# Patient Record
Sex: Female | Born: 2000 | Race: White | Hispanic: No | Marital: Single | Smoking: Never smoker
Health system: Southern US, Community
[De-identification: ages and names within clinical notes are randomized; demographics above are authoritative.]

## PROBLEM LIST (undated history)

## (undated) DIAGNOSIS — F32A Depression, unspecified: Secondary | ICD-10-CM

---

## 2019-11-25 ENCOUNTER — Other Ambulatory Visit: Payer: Self-pay | Admitting: Gastroenterology

## 2019-11-25 DIAGNOSIS — R1013 Epigastric pain: Secondary | ICD-10-CM

## 2019-11-25 DIAGNOSIS — R112 Nausea with vomiting, unspecified: Secondary | ICD-10-CM

## 2019-12-15 ENCOUNTER — Ambulatory Visit
Admission: RE | Admit: 2019-12-15 | Discharge: 2019-12-15 | Disposition: A | Discharge status: Home / Self Care | Payer: 59 | Source: Ambulatory Visit | Attending: Gastroenterology | Admitting: Gastroenterology

## 2019-12-15 DIAGNOSIS — R1013 Epigastric pain: Secondary | ICD-10-CM

## 2019-12-15 DIAGNOSIS — R112 Nausea with vomiting, unspecified: Secondary | ICD-10-CM

## 2019-12-15 IMAGING — US US ABDOMEN LIMITED
1 series · 14 of 25 positions shown · non-contrast
Comparison: None.

CLINICAL DATA: Epigastric pain

EXAM:
ULTRASOUND ABDOMEN LIMITED RIGHT UPPER QUADRANT

[Series 1: us abdomen limited · 0.15mm/px · 14 of 48 slices shown]
[im 1/48]
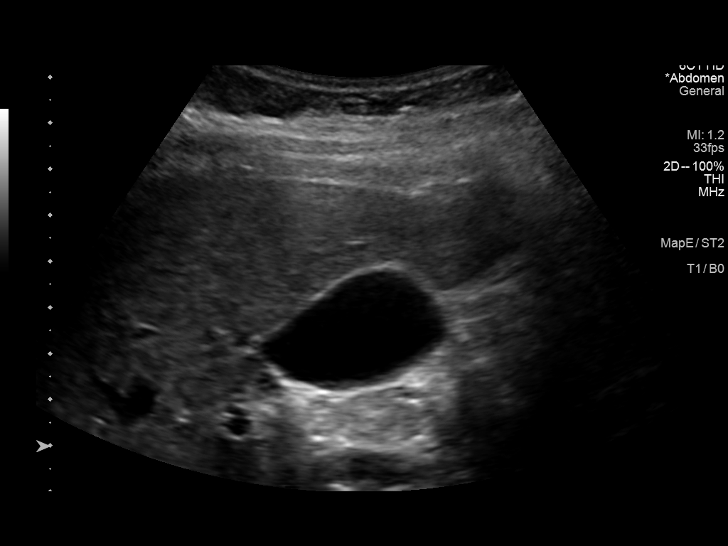
[im 4/48]
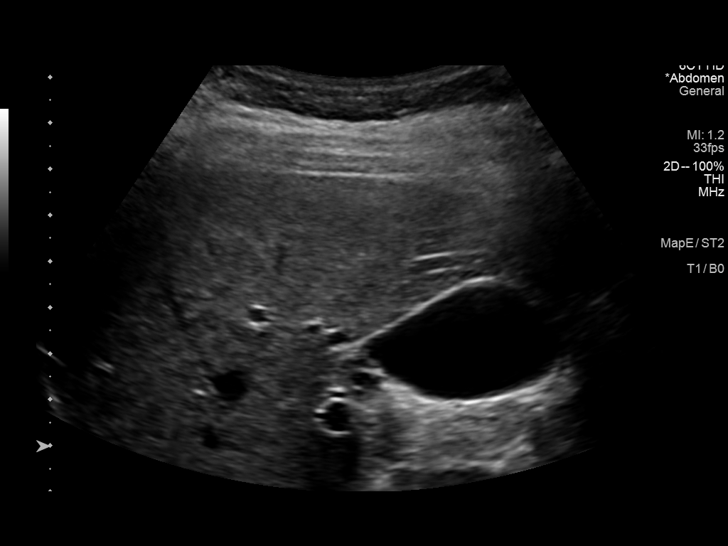
[im 8/48]
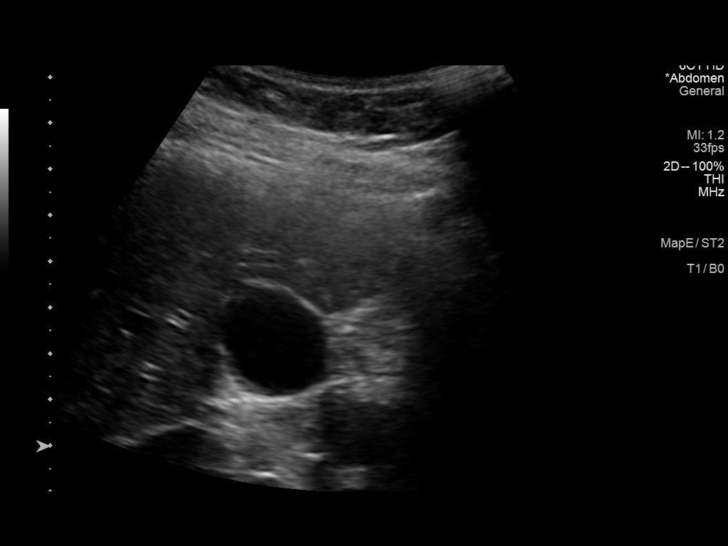
[im 12/48]
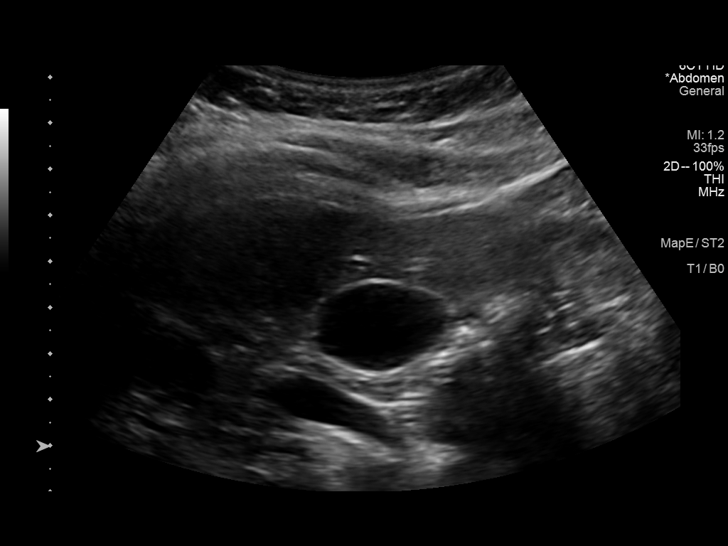
[im 16/48]
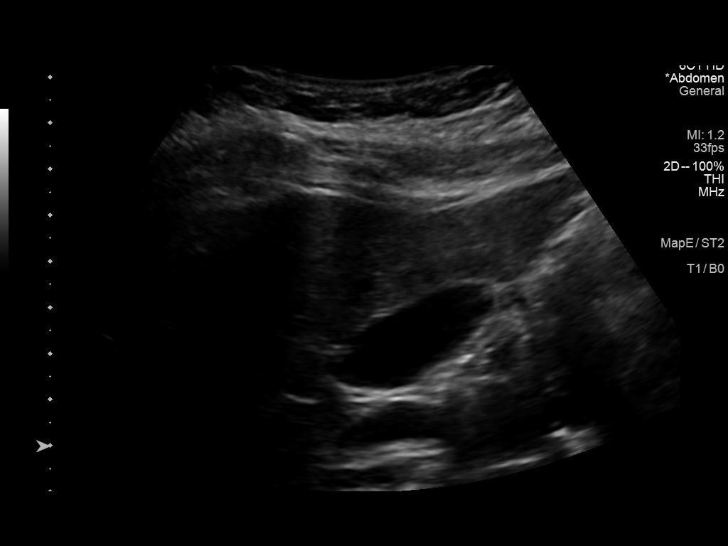
[im 18/48]
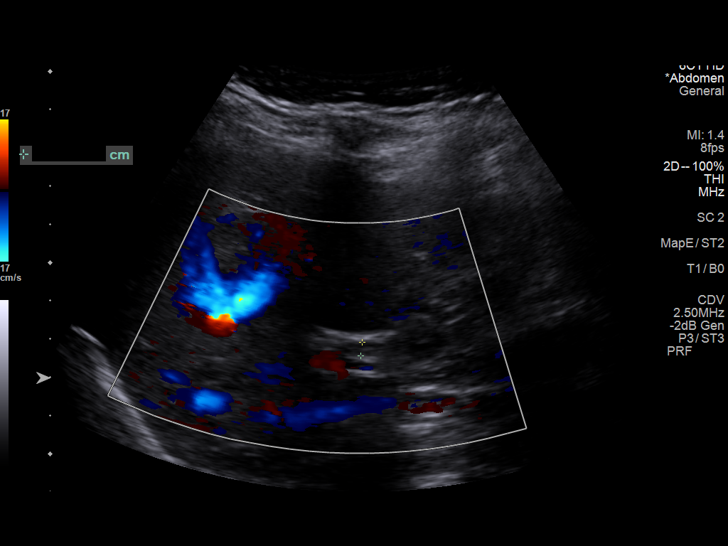
[im 22/48]
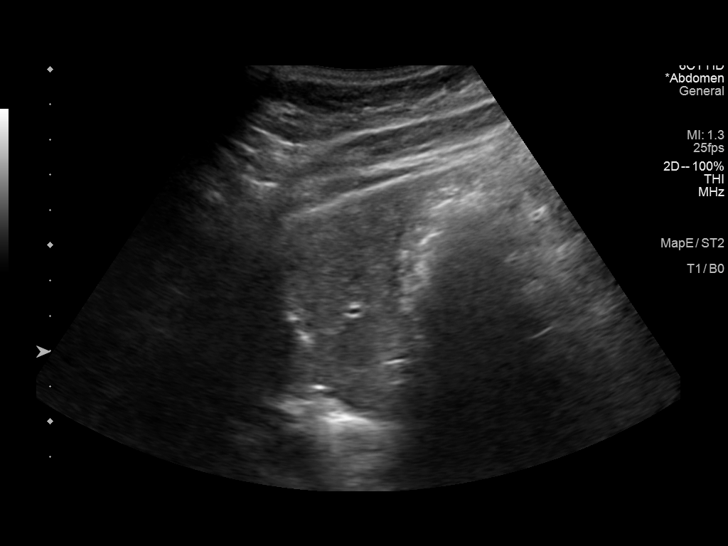
[im 26/48]
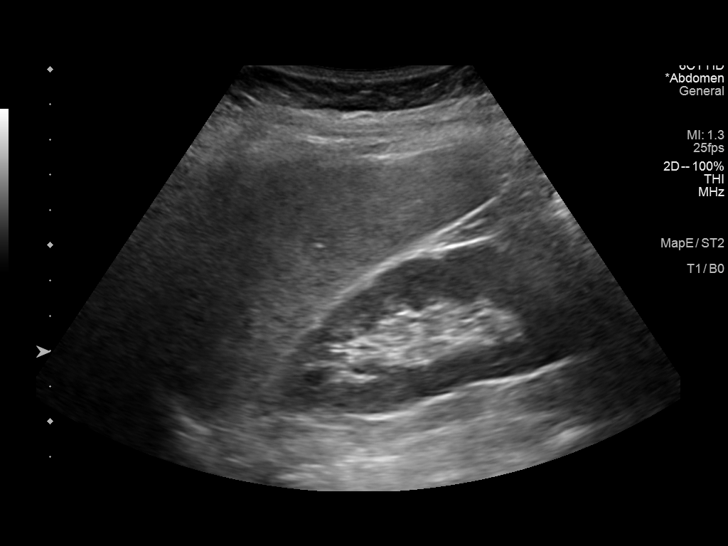
[im 30/48]
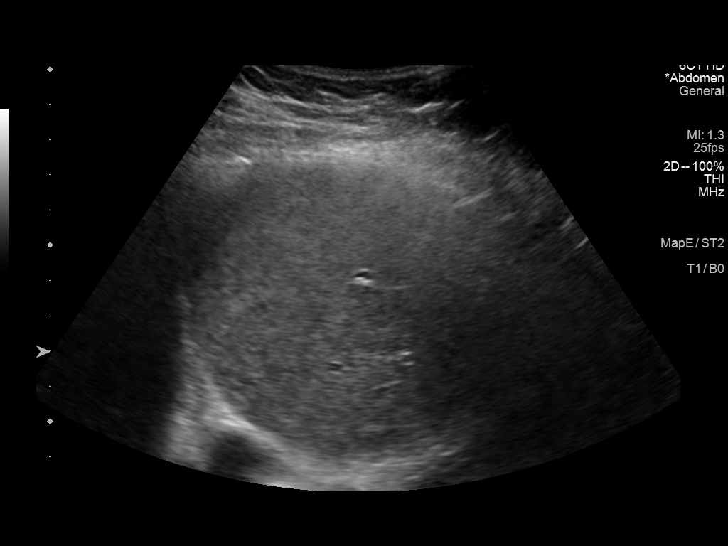
[im 32/48]
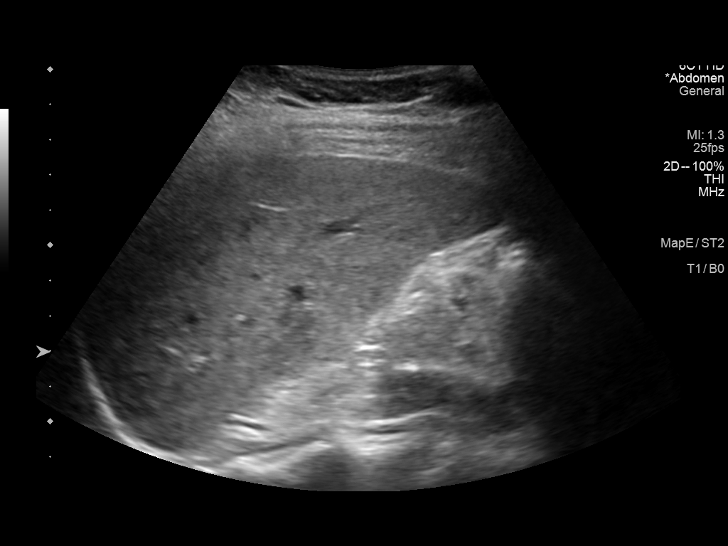
[im 36/48]
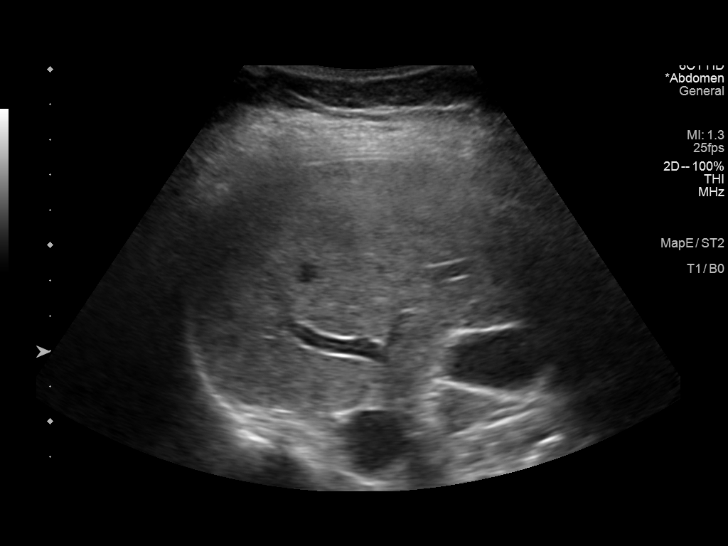
[im 40/48]
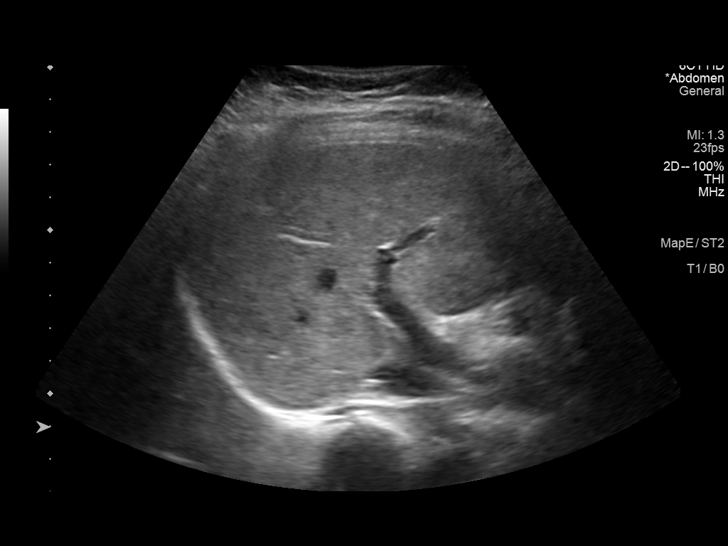
[im 44/48]
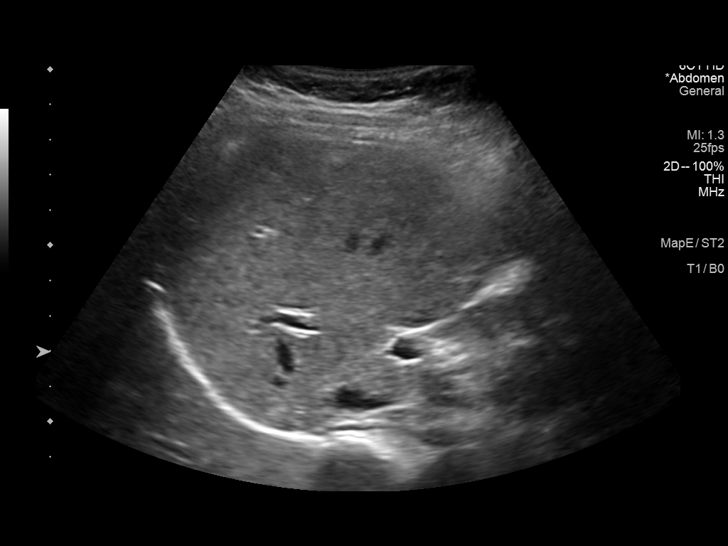
[im 48/48]
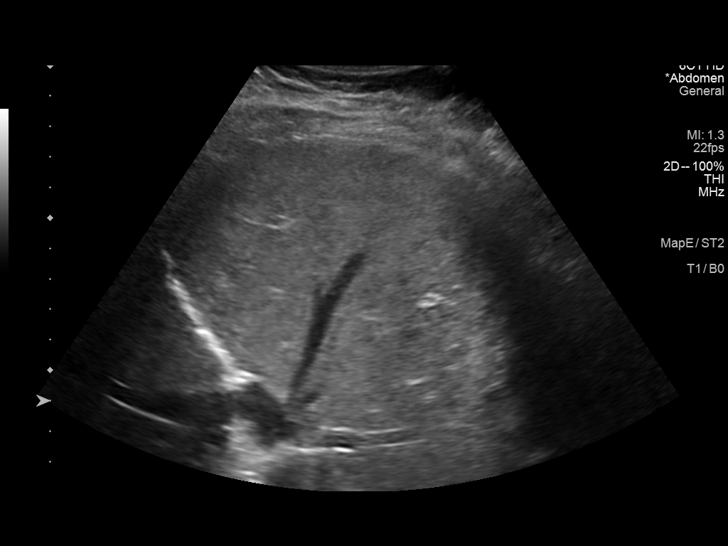

[14 of 25 positions shown; findings below may reference images not displayed]

FINDINGS: Gallbladder:

No gallstones or wall thickening visualized. No sonographic Murphy
sign noted by sonographer.

Common bile duct:

Diameter: 3.6 mm

Liver:

No focal lesion identified. Within normal limits in parenchymal
echogenicity. Portal vein is patent on color Doppler imaging with
normal direction of blood flow towards the liver.

Other: None.
IMPRESSION: Negative right upper quadrant abdominal ultrasound

## 2020-06-13 ENCOUNTER — Other Ambulatory Visit: Payer: Self-pay | Admitting: Physician Assistant

## 2020-06-13 DIAGNOSIS — R112 Nausea with vomiting, unspecified: Secondary | ICD-10-CM

## 2020-07-01 ENCOUNTER — Other Ambulatory Visit: Payer: Self-pay

## 2020-07-01 ENCOUNTER — Encounter (HOSPITAL_COMMUNITY)
Admission: RE | Admit: 2020-07-01 | Discharge: 2020-07-01 | Disposition: A | Discharge status: Home / Self Care | Payer: 59 | Source: Ambulatory Visit | Attending: Physician Assistant | Admitting: Physician Assistant

## 2020-07-01 DIAGNOSIS — R112 Nausea with vomiting, unspecified: Secondary | ICD-10-CM | POA: Insufficient documentation

## 2020-07-01 MED ORDER — TECHNETIUM TC 99M SULFUR COLLOID
2.1000 | Freq: Once | INTRAVENOUS | Status: AC | PRN
  Administered 2020-07-01: 2.1 via INTRAVENOUS

## 2020-09-23 ENCOUNTER — Other Ambulatory Visit: Payer: Self-pay

## 2020-09-23 ENCOUNTER — Encounter (HOSPITAL_COMMUNITY): Payer: Self-pay

## 2020-09-23 ENCOUNTER — Emergency Department (HOSPITAL_COMMUNITY)
Admission: EM | Admit: 2020-09-23 | Discharge: 2020-09-24 | Disposition: A | Discharge status: Other Acute Inpatient Hospital | Payer: 59 | Attending: Emergency Medicine | Admitting: Emergency Medicine

## 2020-09-23 DIAGNOSIS — X58XXXA Exposure to other specified factors, initial encounter: Secondary | ICD-10-CM | POA: Insufficient documentation

## 2020-09-23 DIAGNOSIS — R440 Auditory hallucinations: Secondary | ICD-10-CM | POA: Diagnosis not present

## 2020-09-23 DIAGNOSIS — R441 Visual hallucinations: Secondary | ICD-10-CM | POA: Insufficient documentation

## 2020-09-23 DIAGNOSIS — Z20822 Contact with and (suspected) exposure to covid-19: Secondary | ICD-10-CM | POA: Insufficient documentation

## 2020-09-23 DIAGNOSIS — D72829 Elevated white blood cell count, unspecified: Secondary | ICD-10-CM | POA: Diagnosis not present

## 2020-09-23 DIAGNOSIS — T43222A Poisoning by selective serotonin reuptake inhibitors, intentional self-harm, initial encounter: Secondary | ICD-10-CM | POA: Diagnosis present

## 2020-09-23 DIAGNOSIS — Y900 Blood alcohol level of less than 20 mg/100 ml: Secondary | ICD-10-CM | POA: Insufficient documentation

## 2020-09-23 DIAGNOSIS — T1491XA Suicide attempt, initial encounter: Secondary | ICD-10-CM

## 2020-09-23 HISTORY — DX: Depression, unspecified: F32.A

## 2020-09-23 LAB — SALICYLATE LEVEL: Salicylate Lvl: <7 mg/dL — ABNORMAL LOW (ref 7.0–30.0)

## 2020-09-23 LAB — CBC
HCT: 41.3 % (ref 36.0–46.0)
Hemoglobin: 13.3 g/dL (ref 12.0–15.0)
MCH: 28.7 pg (ref 26.0–34.0)
MCHC: 32.2 g/dL (ref 30.0–36.0)
MCV: 89.2 fL (ref 80.0–100.0)
Platelets: 383 10*3/uL (ref 150–400)
RBC: 4.63 MIL/uL (ref 3.87–5.11)
RDW: 14.6 % (ref 11.5–15.5)
WBC: 15 10*3/uL — ABNORMAL HIGH (ref 4.0–10.5)
nRBC: 0 % (ref 0.0–0.2)

## 2020-09-23 LAB — ETHANOL: Alcohol, Ethyl (B): <10 mg/dL (ref <10)

## 2020-09-23 LAB — MAGNESIUM: Magnesium: 2.2 mg/dL (ref 1.7–2.4)

## 2020-09-23 LAB — RAPID URINE DRUG SCREEN, HOSP PERFORMED
Amphetamines: NOT DETECTED
Barbiturates: NOT DETECTED
Benzodiazepines: NOT DETECTED
Cocaine: NOT DETECTED
Opiates: NOT DETECTED
Tetrahydrocannabinol: NOT DETECTED

## 2020-09-23 LAB — COMPREHENSIVE METABOLIC PANEL
ALT: 18 U/L (ref 0–44)
AST: 20 U/L (ref 15–41)
Albumin: 4.3 g/dL (ref 3.5–5.0)
Alkaline Phosphatase: 95 U/L (ref 38–126)
Anion gap: 8 (ref 5–15)
BUN: 7 mg/dL (ref 6–20)
CO2: 26 mmol/L (ref 22–32)
Calcium: 9.3 mg/dL (ref 8.9–10.3)
Chloride: 106 mmol/L (ref 98–111)
Creatinine, Ser: 0.62 mg/dL (ref 0.44–1.00)
GFR, Estimated: >60 mL/min (ref >60)
Glucose, Bld: 115 mg/dL — ABNORMAL HIGH (ref 70–99)
Potassium: 3.8 mmol/L (ref 3.5–5.1)
Sodium: 140 mmol/L (ref 135–145)
Total Bilirubin: 0.6 mg/dL (ref 0.3–1.2)
Total Protein: 8.6 g/dL — ABNORMAL HIGH (ref 6.5–8.1)

## 2020-09-23 LAB — I-STAT BETA HCG BLOOD, ED (MC, WL, AP ONLY): I-stat hCG, quantitative: <5 m[IU]/mL (ref <5)

## 2020-09-23 LAB — ACETAMINOPHEN LEVEL: Acetaminophen (Tylenol), Serum: <10 ug/mL — ABNORMAL LOW (ref 10–30)

## 2020-09-23 LAB — RESP PANEL BY RT-PCR (FLU A&B, COVID) ARPGX2
Influenza A by PCR: NEGATIVE
Influenza B by PCR: NEGATIVE
SARS Coronavirus 2 by RT PCR: NEGATIVE

## 2020-09-23 MED ORDER — POTASSIUM CHLORIDE CRYS ER 20 MEQ PO TBCR
60.0000 meq | EXTENDED_RELEASE_TABLET | Freq: Once | ORAL | Status: AC
  Administered 2020-09-23: 60 meq via ORAL
  Filled 2020-09-23: qty 3

## 2020-09-23 NOTE — BH Assessment (Signed)
Marchelle Folks, NT/NS to fax [phone] or [phone]) pt's IVC paperwork.     Redmond Pulling, MS, [facility], [facility] Triage Specialist [phone]

## 2020-09-23 NOTE — ED Notes (Signed)
Poison control states pt has completed the 12-hour observation period and has been cleared on their end.

## 2020-09-23 NOTE — ED Notes (Signed)
Please call mom for an update when you can.

## 2020-09-23 NOTE — ED Notes (Signed)
ED Provider at bedside. 

## 2020-09-23 NOTE — BH Assessment (Signed)
Clinician messaged Louellen Molder, RN via secure chat in Epic and asked "is the pt able to assessed, if so the pt will need to be placed in a private room. Also is the pt under IVC."   Clinician awaiting response.   Redmond Pulling, MS, [facility], [facility] Triage Specialist [phone]

## 2020-09-23 NOTE — ED Provider Notes (Addendum)
[facility]-EMERGENCY DEPT Provider Note   CSN: [id number] Arrival date & time: 09/23/20  1145     History Chief Complaint  Patient presents with   Suicidal    Brenda Newman is a 20 y.o. female.  HPI  Patient is a 20 year old female with a history of depression who presents to the emergency department due to purposeful ingestion of Prozac in an attempt to commit suicide.  Patient states that she has a history of depression.  She states that nothing new or abnormal happened recently but just "felt worse than normal this morning" so she took about 400 mg of Prozac around 10:30 AM.  She reports associated visual and auditory hallucinations.  She states that "sometimes I hear whispers".  She also states that "sometimes I see man walking around the house or sometimes see people walking around outside that are not there".  Denies any homicidal ideation.  She reports current fatigue and nausea but otherwise has no physical complaints.     Past Medical History:  Diagnosis Date   Depression     There are no problems to display for this patient.   History reviewed. No pertinent surgical history.   OB History   No obstetric history on file.     Family History  Problem Relation Age of Onset   Diabetes Mother     Social History   Tobacco Use   Smoking status: Never   Smokeless tobacco: Never  Vaping Use   Vaping Use: Never used  Substance Use Topics   Alcohol use: Never   Drug use: Never    Home Medications Prior to Admission medications   Not on File    Allergies    Patient has no known allergies.  Review of Systems   Review of Systems  All other systems reviewed and are negative. Ten systems reviewed and are negative for acute change, except as noted in the HPI.   Physical Exam Updated Vital Signs BP 131/85   Pulse (!) 108   Temp 98.7 F (37.1 C) (Oral)   Resp 16   Ht 5' (1.524 m)   Wt 61.2 kg   LMP 09/01/2020 (Approximate)   SpO2  100%   BMI 26.37 kg/m   Physical Exam Vitals and nursing note reviewed.  Constitutional:      General: She is not in acute distress.    Appearance: Normal appearance. She is not ill-appearing, toxic-appearing or diaphoretic.  HENT:     Head: Normocephalic and atraumatic.     Right Ear: External ear normal.     Left Ear: External ear normal.     Nose: Nose normal.     Mouth/Throat:     Mouth: Mucous membranes are moist.     Pharynx: Oropharynx is clear. No oropharyngeal exudate or posterior oropharyngeal erythema.  Eyes:     Extraocular Movements: Extraocular movements intact.  Cardiovascular:     Rate and Rhythm: Normal rate and regular rhythm.     Pulses: Normal pulses.     Heart sounds: Normal heart sounds. No murmur heard.   No friction rub. No gallop.  Pulmonary:     Effort: Pulmonary effort is normal. No respiratory distress.     Breath sounds: Normal breath sounds. No stridor. No wheezing, rhonchi or rales.  Abdominal:     General: Abdomen is flat.     Palpations: Abdomen is soft.     Tenderness: There is no abdominal tenderness.  Musculoskeletal:  General: Normal range of motion.     Cervical back: Normal range of motion and neck supple. No tenderness.  Skin:    General: Skin is warm and dry.     Comments: Well-healing self-inflicted linear lacerations to the left forearm.  Neurological:     General: No focal deficit present.     Mental Status: She is alert and oriented to person, place, and time.  Psychiatric:        Attention and Perception: Attention normal.        Mood and Affect: Mood normal.        Behavior: Behavior normal. Behavior is not agitated or aggressive.        Thought Content: Thought content is delusional. Thought content is not paranoid. Thought content includes suicidal ideation. Thought content does not include homicidal ideation. Thought content includes suicidal plan. Thought content does not include homicidal plan.   ED Results /  Procedures / Treatments   Labs (all labs ordered are listed, but only abnormal results are displayed) Labs Reviewed  COMPREHENSIVE METABOLIC PANEL - Abnormal; Notable for the following components:      Result Value   Glucose, Bld 115 (*)    Total Protein 8.6 (*)    All other components within normal limits  SALICYLATE LEVEL - Abnormal; Notable for the following components:   Salicylate Lvl <7.0 (*)    All other components within normal limits  ACETAMINOPHEN LEVEL - Abnormal; Notable for the following components:   Acetaminophen (Tylenol), Serum <10 (*)    All other components within normal limits  CBC - Abnormal; Notable for the following components:   WBC 15.0 (*)    All other components within normal limits  RESP PANEL BY RT-PCR (FLU A&B, COVID) ARPGX2  ETHANOL  RAPID URINE DRUG SCREEN, HOSP PERFORMED  MAGNESIUM  I-STAT BETA HCG BLOOD, ED ([facility], [facility], [facility] ONLY)   EKG EKG Interpretation  Date/Time:  Friday September 23 2020 13:02:07 EDT Ventricular Rate:  99 PR Interval:  142 QRS Duration: 74 QT Interval:  344 QTC Calculation: 441 R Axis:   87 Text Interpretation: Normal sinus rhythm Normal ECG agree, no old comparison Confirmed by Arby Barrette [phone]) on 09/23/2020 5:45:27 PM  Radiology No results found.  Procedures Procedures   Medications Ordered in ED Medications  potassium chloride SA (KLOR-CON) CR tablet 60 mEq (60 mEq Oral Given 09/23/20 1459)   ED Course  I have reviewed the triage vital signs and the nursing notes.  Pertinent labs & imaging results that were available during my care of the patient were reviewed by me and considered in my medical decision making (see chart for details).  Clinical Course as of 09/23/20 1748  Fri Sep 23, 2020  1248 Patient discussed with Angelique Blonder at [facility] poison control.  Recommended least an 8-hour observation.  Expect patient to experience both fatigue as well as GI upset.  Recommend an EKG now as well as again in 4 hours  around 5 PM.  Check for prolonged QTC.  If prolonged, check a magnesium level as well.  Recommend keeping potassium and magnesium levels around the upper limits of normal.  Otherwise, supportive care. [LJ]  1737 Patient reassessed.  States her symptoms are improving.  Still mildly fatigued.  Will obtain a repeat ECG.  We will continue to monitor. [LJ]    Clinical Course User Index [LJ] Placido Sou, PA-C   MDM Rules/Calculators/A&P  Pt is a 20 y.o. female with a history of depression who presents to the emergency department due to a suicide attempt this morning in which she overdosed on Prozac.  Labs: CBC with leukocytosis of 15. CMP with a glucose of 115 and a total protein of 8.6. Ethanol less than 10. Magnesium 2.2. Salicylate less than 7. Acetaminophen less than 10. UDS negative. I-STAT beta-hCG less than 5. Respiratory panel is negative.  ECG: Initial EKG shows normal sinus rhythm.  Repeat EKG at 5 PM again shows normal sinus rhythm.  I, Placido Sou, PA-C, personally reviewed and evaluated these images and lab results as part of my medical decision-making.  Patient initially appeared fatigued and nauseated upon arrival.  She has been reevaluated and her symptoms have since improved.  Lab work mostly reassuring.  UDS negative.  Magnesium within normal limits.  Potassium also within normal limits but near the lower end of normal so patient was repleted with Klor-Con.  Initial ECG and repeat ECG both showed normal sinus rhythm with a normal QT interval.  It is in my shift and patient care is being transferred to [facility].  [facility] [place] poison control recommended an 8-hour observation which should be complete around 9 PM tonight.  If patient continues to be improving feel that she will be medically clear at that time and will need TTS consultation at that point.  She has been involuntarily committed.  Note: Portions of this report may have been  transcribed using voice recognition software. Every effort was made to ensure accuracy; however, inadvertent computerized transcription errors may be present.   Final Clinical Impression(s) / ED Diagnoses Final diagnoses:  Suicide attempt [facility])  Intentional overdose of selective serotonin reuptake inhibitor (SSRI), initial encounter [facility]   Rx / DC Orders ED Discharge Orders     None        Placido Sou, PA-C 09/23/20 1747    Placido Sou, PA-C 09/23/20 1748    Arby Barrette, MD 09/23/20 [phone]

## 2020-09-23 NOTE — ED Notes (Signed)
[facility]iff called to transport pt to [facility]

## 2020-09-23 NOTE — ED Triage Notes (Addendum)
Patient states she took 8 Prozac tabs 1 hour prior t coming to the Ed. Patient states that she was trying to kill herself. Patient denies any HI.  Patient states she is having bother visual and auditory hallucinations.  Patient denies any alcohol or other drugs.  Patient has multiple superficial lacerations to the left arm. Patient states she did some of them 2 days ago, but most were done last night. Patient used a Interior and spatial designer.

## 2020-09-23 NOTE — ED Notes (Signed)
Updated Denise from poison control regarding pts mental status and care.

## 2020-09-23 NOTE — ED Notes (Addendum)
Patient was unhooked to go to the restroom per her request. Patient asked when the MD would be back to see her. I told her I would ask the nurse. Called over the radio for Brayton Caves to inform her of patients question. NT was also made aware.
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
## 2020-09-23 NOTE — ED Provider Notes (Signed)
Patient is a 20 year old female here for suicide attempt.  Care has been taken over at signout from [facility].  Patient reported taking 400 mg of Prozac around 10:30 AM this morning and having visual and auditory hallucinations.  She currently is fatigued with some nausea.   Physical Exam  BP 131/85   Pulse (!) 108   Temp 98.7 F (37.1 C) (Oral)   Resp 16   Ht 5' (1.524 m)   Wt 61.2 kg   LMP 09/01/2020 (Approximate)   SpO2 100%   BMI 26.37 kg/m   Physical Exam Vitals and nursing note reviewed. Exam conducted with a chaperone present.  Constitutional:      General: She is not in acute distress.    Appearance: Normal appearance.  HENT:     Head: Normocephalic and atraumatic.  Eyes:     General: No scleral icterus.    Extraocular Movements: Extraocular movements intact.     Pupils: Pupils are equal, round, and reactive to light.  Skin:    Coloration: Skin is not jaundiced.  Neurological:     Mental Status: She is alert. Mental status is at baseline.     Coordination: Coordination normal.  Psychiatric:        Mood and Affect: Mood normal.     Comments: Flat affect    ED Course/Procedures   Clinical Course as of 09/23/20 2031  Fri Sep 23, 2020  1248 Patient discussed with Angelique Blonder at [facility] poison control.  Recommended least an 8-hour observation.  Expect patient to experience both fatigue as well as GI upset.  Recommend an EKG now as well as again in 4 hours around 5 PM.  Check for prolonged QTC.  If prolonged, check a magnesium level as well.  Recommend keeping potassium and magnesium levels around the upper limits of normal.  Otherwise, supportive care. [LJ]  1737 Patient reassessed.  States her symptoms are improving.  Still mildly fatigued.  Will obtain a repeat ECG.  We will continue to monitor. [LJ]    Clinical Course User Index [LJ] Placido Sou, PA-C    Procedures  MDM   Poison control recommended rechecking her for symptoms and QTC changes  at 9:00 PM.  If she is asymptomatic with appropriate QTC she will be cleared medically and consult TTS will be placed.  Patient reports resolution of the nausea and fatigue.  She states she is feeling well and voices desire to go home.  I discussed with her that she needs to be evaluated by psych.  She is agreeable to stay.  At this point she is medically cleared for TTS consult.      Theron Arista, PA-C 09/23/20 2032    Little, Ambrose Finland, MD 09/24/20 [phone]

## 2020-09-24 ENCOUNTER — Other Ambulatory Visit: Payer: Self-pay

## 2020-09-24 ENCOUNTER — Inpatient Hospital Stay (HOSPITAL_COMMUNITY)
Admission: AD | Admit: 2020-09-24 | Discharge: 2020-09-27 | DRG: 885 | Disposition: A | Discharge status: Home / Self Care | Payer: 59 | Source: Intra-hospital | Attending: Psychiatry | Admitting: Psychiatry

## 2020-09-24 DIAGNOSIS — D509 Iron deficiency anemia, unspecified: Secondary | ICD-10-CM | POA: Diagnosis present

## 2020-09-24 DIAGNOSIS — Z9152 Personal history of nonsuicidal self-harm: Secondary | ICD-10-CM

## 2020-09-24 DIAGNOSIS — F4310 Post-traumatic stress disorder, unspecified: Secondary | ICD-10-CM | POA: Diagnosis present

## 2020-09-24 DIAGNOSIS — F333 Major depressive disorder, recurrent, severe with psychotic symptoms: Principal | ICD-10-CM | POA: Diagnosis present

## 2020-09-24 DIAGNOSIS — Z9151 Personal history of suicidal behavior: Secondary | ICD-10-CM

## 2020-09-24 DIAGNOSIS — F4010 Social phobia, unspecified: Secondary | ICD-10-CM | POA: Diagnosis present

## 2020-09-24 DIAGNOSIS — K219 Gastro-esophageal reflux disease without esophagitis: Secondary | ICD-10-CM | POA: Diagnosis present

## 2020-09-24 DIAGNOSIS — K589 Irritable bowel syndrome without diarrhea: Secondary | ICD-10-CM | POA: Diagnosis present

## 2020-09-24 DIAGNOSIS — T43222A Poisoning by selective serotonin reuptake inhibitors, intentional self-harm, initial encounter: Secondary | ICD-10-CM | POA: Diagnosis present

## 2020-09-24 LAB — LIPID PANEL
Cholesterol: 152 mg/dL (ref 0–200)
HDL: 48 mg/dL (ref >40)
LDL Cholesterol: 95 mg/dL (ref 0–99)
Total CHOL/HDL Ratio: 3.2 RATIO
Triglycerides: 47 mg/dL (ref <150)
VLDL: 9 mg/dL (ref 0–40)

## 2020-09-24 LAB — HEMOGLOBIN A1C
Hgb A1c MFr Bld: 5.3 % (ref 4.8–5.6)
Mean Plasma Glucose: 105.41 mg/dL

## 2020-09-24 LAB — TSH: TSH: 1.307 u[IU]/mL (ref 0.350–4.500)

## 2020-09-24 MED ORDER — HYOSCYAMINE SULFATE 0.125 MG SL SUBL
0.1250 mg | SUBLINGUAL_TABLET | Freq: Three times a day (TID) | SUBLINGUAL | Status: DC
  Administered 2020-09-24 – 2020-09-27 (×11): 0.125 mg via SUBLINGUAL
  Filled 2020-09-24 (×21): qty 1

## 2020-09-24 MED ORDER — FERROUS SULFATE 325 (65 FE) MG PO TABS
325.0000 mg | ORAL_TABLET | Freq: Every day | ORAL | Status: DC
  Administered 2020-09-25 – 2020-09-27 (×3): 325 mg via ORAL
  Filled 2020-09-24 (×5): qty 1

## 2020-09-24 MED ORDER — ESCITALOPRAM OXALATE 5 MG PO TABS
5.0000 mg | ORAL_TABLET | Freq: Once | ORAL | Status: AC
  Administered 2020-09-24: 5 mg via ORAL

## 2020-09-24 MED ORDER — PANTOPRAZOLE SODIUM 40 MG PO TBEC
40.0000 mg | DELAYED_RELEASE_TABLET | Freq: Every day | ORAL | Status: DC
  Administered 2020-09-24 – 2020-09-27 (×4): 40 mg via ORAL
  Filled 2020-09-24 (×7): qty 1

## 2020-09-24 MED ORDER — ACETAMINOPHEN 325 MG PO TABS: 650.0000 mg | ORAL_TABLET | Freq: Four times a day (QID) | ORAL | Status: DC | PRN

## 2020-09-24 MED ORDER — TRAZODONE HCL 50 MG PO TABS
50.0000 mg | ORAL_TABLET | Freq: Every evening | ORAL | Status: DC | PRN
  Administered 2020-09-26: 50 mg via ORAL
  Filled 2020-09-24 (×2): qty 1

## 2020-09-24 MED ORDER — ESCITALOPRAM OXALATE 10 MG PO TABS
ORAL_TABLET | ORAL | Status: AC
  Filled 2020-09-24: qty 1

## 2020-09-24 MED ORDER — MAGNESIUM HYDROXIDE 400 MG/5ML PO SUSP: 30.0000 mL | Freq: Every day | ORAL | Status: DC | PRN

## 2020-09-24 MED ORDER — ALUM & MAG HYDROXIDE-SIMETH 200-200-20 MG/5ML PO SUSP: 30.0000 mL | ORAL | Status: DC | PRN

## 2020-09-24 MED ORDER — ESCITALOPRAM OXALATE 10 MG PO TABS
10.0000 mg | ORAL_TABLET | Freq: Every day | ORAL | Status: DC
  Administered 2020-09-25 – 2020-09-27 (×3): 10 mg via ORAL
  Filled 2020-09-24 (×5): qty 1

## 2020-09-24 NOTE — Progress Notes (Signed)
DAR Note: Patient calm and cooperative, denies SI/HI/AVH, denies any current concerns, reports an improved mood and improved sleep quality. Pt maintained on Q15 minute safety checks, and is visible in the day room interacting with her peers.   09/24/20 1444  Psych Admission Type (Psych Patients Only)  Admission Status Involuntary  Psychosocial Assessment  Patient Complaints Depression  Eye Contact Fair  Facial Expression Flat  Affect Appropriate to circumstance  Speech Logical/coherent  Interaction Assertive  Motor Activity Other (Comment) (patient ambulatory)  Appearance/Hygiene Unremarkable  Behavior Characteristics Cooperative  Mood Pleasant;Euthymic  Thought Process  Coherency WDL  Content WDL  Delusions WDL  Perception WDL  Hallucination Auditory;Visual  Judgment WDL  Confusion WDL  Danger to Self  Current suicidal ideation? Denies  Self-Injurious Behavior No self-injurious ideation or behavior indicators observed or expressed   Agreement Not to Harm Self Yes  Description of Agreement contracts  Danger to Others  Danger to Others None reported or observed

## 2020-09-24 NOTE — BHH Group Notes (Signed)
LCSW Group Therapy Note  09/24/2020   10:00-11:00am   Type of Therapy and Topic:  Group Therapy: Anger Cues and Responses  Participation Level:  Active   Description of Group:   In this group, patients learned how to recognize the physical, cognitive, emotional, and behavioral responses they have to anger-provoking situations.  They identified a recent time they became angry and how they reacted.  They analyzed how their reaction was possibly beneficial and how it was possibly unhelpful.  The group discussed a variety of healthier coping skills that could help with such a situation in the future.  Focus was placed on how helpful it is to recognize the underlying emotions to our anger, because working on those can lead to a more permanent solution as well as our ability to focus on the important rather than the urgent.  Therapeutic Goals: Patients will remember their last incident of anger and how they felt emotionally and physically, what their thoughts were at the time, and how they behaved. Patients will identify how their behavior at that time worked for them, as well as how it worked against them. Patients will explore possible new behaviors to use in future anger situations. Patients will learn that anger itself is normal and cannot be eliminated, and that healthier reactions can assist with resolving conflict rather than worsening situations.  Summary of Patient Progress:  The patient shared that her most recent time of anger was this morning  but was relatively vague and said she was able to process and move on.  Therapeutic Modalities:   Cognitive Behavioral Therapy  Evorn Gong
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
## 2020-09-24 NOTE — [facility] Suicide Risk Assessment (Signed)
[facility] Admission Suicide Risk Assessment   Nursing information obtained from:  Patient Demographic factors:  Adolescent or young adult, Caucasian Current Mental Status:  Self-harm behaviors, Self-harm thoughts Loss Factors:  Loss of significant relationship Historical Factors:  Prior suicide attempts, Impulsivity Risk Reduction Factors:  Employed, Living with another person, especially a relative, Sense of responsibility to family  Total Time Spent in Direct Patient Care:  I personally spent 70 minutes on the unit in direct patient care. The direct patient care time included face-to-face time with the patient, reviewing the patient's chart, communicating with other professionals, and coordinating care. Greater than 50% of this time was spent in counseling or coordinating care with the patient regarding goals of hospitalization, psycho-education, and discharge planning needs.  Principal Problem: MDD (major depressive disorder), recurrent, severe, with psychosis (HCC) Diagnosis:  Principal Problem:   MDD (major depressive disorder), recurrent, severe, with psychosis (HCC) Active Problems:   Social anxiety disorder   PTSD (post-traumatic stress disorder)  Subjective Data: see admission H&P  The "Alcohol Use Disorders Identification Test", Guidelines for Use in Primary Care, Second Edition.  [employer] [facility]). Score between 0-7:  no or low risk or alcohol related problems. Score between 8-15:  moderate risk of alcohol related problems. Score between 16-19:  high risk of alcohol related problems. Score 20 or above:  warrants further diagnostic evaluation for alcohol dependence and treatment.  Musculoskeletal: Strength & Muscle Tone: within normal limits Gait & Station: normal, steady Patient leans: N/A Psychiatric Specialty Exam: Physical Exam Vitals reviewed. HENT:    Head: Normocephalic.    Mouth/Throat:    Mouth: Mucous membranes are moist. Eyes:    Extraocular  Movements: Extraocular movements intact. Cardiovascular:    Rate and Rhythm: Normal rate and regular rhythm. Pulmonary:    Effort: Pulmonary effort is normal.    Breath sounds: Normal breath sounds. Musculoskeletal:    Comments: Linear, superficial cuts on left inner forearm  Skin:    General: Skin is warm and dry. Neurological:    General: No focal deficit present.    Mental Status: She is alert.    Comments: CN 3-12 grossly intact, intact finger to nose     Review of Systems  Blood pressure (!) 122/93, pulse (!) 107, temperature 98.2 F (36.8 C), temperature source Oral, last menstrual period 09/01/2020, SpO2 99 %.There is no height or weight on file to calculate BMI.  General Appearance:  casually dressed, adequate hygiene  Eye Contact:  Good  Speech:  Clear and Coherent and Normal Rate  Volume:  Normal  Mood:  Depressed  Affect:  Constricted  Thought Process:  Goal Directed and Linear  Orientation:  Full (Time, Place, and Person)  Thought Content:   Endorses intermittent AH and paranoia; denies ideas of reference or first rank symptoms or VH  Suicidal Thoughts:   SI prior to admission with attempt - denies current SI intent or plan  Homicidal Thoughts:  No  Memory:  Recent;   Good  Judgement:  Fair  Insight:  Present  Psychomotor Activity:  Normal  Concentration:  Concentration: Good and Attention Span: Good  Recall:  Good  Fund of Knowledge:  Good  Language:  Good  Akathisia:  Negative  Assets:  Communication Skills Desire for Improvement Housing Resilience Social Support  ADL's:  Intact  Cognition:  WNL  Sleep:  Number of Hours: 4.25    CLINICAL FACTORS:   Depression:   Anhedonia Hopelessness Impulsivity Severe Personality Disorders:   Cluster B  More than one psychiatric diagnosis Previous Psychiatric Diagnoses and Treatments  COGNITIVE FEATURES THAT CONTRIBUTE TO RISK:  None    SUICIDE RISK:   Moderate:  Frequent suicidal ideation with limited  intensity, and duration, some specificity in terms of plans, no associated intent, good self-control, limited dysphoria/symptomatology, some risk factors present, and identifiable protective factors, including available and accessible social support.  PLAN OF CARE: see admission H&P  I certify that inpatient services furnished can reasonably be expected to improve the patient's condition.   Comer Locket, MD, FAPA 09/24/2020, 4:56 PM
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
## 2020-09-24 NOTE — Progress Notes (Signed)
Adult Psychoeducational Group Note  Date:  09/24/2020 Time:  9:47 AM  Group Topic/Focus:  Goals Group:   The focus of this group is to help patients establish daily goals to achieve during treatment and discuss how the patient can incorporate goal setting into their daily lives to aide in recovery.  Participation Level:  Did Not Attend  Brenda Newman [facility] 09/24/2020, 9:47 AM
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
## 2020-09-24 NOTE — Progress Notes (Signed)
   09/24/20 2233  Psych Admission Type (Psych Patients Only)  Admission Status Involuntary  Psychosocial Assessment  Patient Complaints Depression  Eye Contact Fair  Facial Expression Flat  Affect Appropriate to circumstance  Speech Logical/coherent  Interaction Assertive  Motor Activity Other (Comment) (WDL)  Appearance/Hygiene Unremarkable  Behavior Characteristics Appropriate to situation  Mood Pleasant;Depressed  Thought Process  Coherency WDL  Content WDL  Delusions None reported or observed  Perception WDL  Hallucination Auditory;Visual  Judgment Poor  Confusion None  Danger to Self  Self-Injurious Behavior No self-injurious ideation or behavior indicators observed or expressed   Agreement Not to Harm Self Yes  Description of Agreement contracts  Danger to Others  Danger to Others None reported or observed
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
## 2020-09-24 NOTE — [facility] Group Notes (Signed)
Adult Psychoeducational Group Note  Date:  09/24/2020 Time:  4:55 PM  Group Topic/Focus:  Coping With Mental Health Crisis:   The purpose of this group is to help patients identify strategies for coping with mental health crisis.  Group discusses possible causes of crisis and ways to manage them effectively.  Participation Level:  Active  Participation Quality:  Appropriate  Affect:  Appropriate  Cognitive:  Appropriate  Insight: Appropriate  Engagement in Group:  Engaged  Modes of Intervention:  Discussion  Additional Comments:  Patient attended coping skills group and participated.   Ceci Taliaferro W Coralyn Roselli 09/24/2020, 4:55 PM

## 2020-09-25 LAB — CBC WITH DIFFERENTIAL/PLATELET
Abs Immature Granulocytes: 0.06 10*3/uL (ref 0.00–0.07)
Basophils Absolute: 0.1 10*3/uL (ref 0.0–0.1)
Basophils Relative: 0 %
Eosinophils Absolute: 0.2 10*3/uL (ref 0.0–0.5)
Eosinophils Relative: 1 %
HCT: 39.6 % (ref 36.0–46.0)
Hemoglobin: 12.6 g/dL (ref 12.0–15.0)
Immature Granulocytes: 0 %
Lymphocytes Relative: 30 %
Lymphs Abs: 4.2 10*3/uL — ABNORMAL HIGH (ref 0.7–4.0)
MCH: 28.8 pg (ref 26.0–34.0)
MCHC: 31.8 g/dL (ref 30.0–36.0)
MCV: 90.6 fL (ref 80.0–100.0)
Monocytes Absolute: 1 10*3/uL (ref 0.1–1.0)
Monocytes Relative: 7 %
Neutro Abs: 8.7 10*3/uL — ABNORMAL HIGH (ref 1.7–7.7)
Neutrophils Relative %: 62 %
Platelets: 349 10*3/uL (ref 150–400)
RBC: 4.37 MIL/uL (ref 3.87–5.11)
RDW: 14.6 % (ref 11.5–15.5)
WBC: 14.2 10*3/uL — ABNORMAL HIGH (ref 4.0–10.5)
nRBC: 0 % (ref 0.0–0.2)

## 2020-09-25 MED ORDER — BACITRACIN-NEOMYCIN-POLYMYXIN OINTMENT TUBE: TOPICAL_OINTMENT | Freq: Two times a day (BID) | CUTANEOUS | Status: DC | PRN

## 2020-09-25 NOTE — Progress Notes (Signed)
   09/25/20 2225  Psych Admission Type (Psych Patients Only)  Admission Status Involuntary  Psychosocial Assessment  Patient Complaints None  Eye Contact Brief  Facial Expression Flat  Affect Appropriate to circumstance  Speech Logical/coherent  Interaction Assertive  Motor Activity Other (Comment) (WDL)  Appearance/Hygiene Unremarkable  Behavior Characteristics Appropriate to situation  Mood Pleasant  Thought Process  Coherency WDL  Content WDL  Delusions None reported or observed  Perception WDL  Hallucination None reported or observed  Judgment WDL  Confusion None  Danger to Self  Current suicidal ideation? Denies  Self-Injurious Behavior No self-injurious ideation or behavior indicators observed or expressed   Agreement Not to Harm Self Yes  Description of Agreement contracts  Danger to Others  Danger to Others None reported or observed

## 2020-09-25 NOTE — [facility] Group Notes (Signed)
[facility] Group Notes:  (Nursing/MHT/Case Management/Adjunct)  Date:  09/25/2020  Time:  5:23 PM  Type of Therapy:  Relaxation therapy  Participation Level:  Did Not Attend  Participation Quality:    Affect:    Cognitive:    Insight:    Engagement in Group:    Modes of Intervention:  Activity  Summary of Progress/Problems:  Did not attend despite staff invitation.    Aliveah Gallant V Treysean Petruzzi 09/25/2020, 5:23 PM 

## 2020-09-25 NOTE — [facility] Suicide Risk Assessment (Signed)
[facility] INPATIENT:  Family/Significant Other Suicide Prevention Education  Suicide Prevention Education:  Education Completed; mother Brenda Newman [phone],  (name of family member/significant other) has been identified by the patient as the family member/significant other with whom the patient will be residing, and identified as the person(s) who will aid the patient in the event of a mental health crisis (suicidal ideations/suicide attempt).  With written consent from the patient, the family member/significant other has been provided the following suicide prevention education, prior to the and/or following the discharge of the patient.  The suicide prevention education provided includes the following: Suicide risk factors Suicide prevention and interventions National Suicide Hotline telephone number [facility] assessment telephone number [facility] Emergency Assistance 911 [facility] and/or Residential Mobile Crisis Unit telephone number  Request made of family/significant other to: Remove weapons (e.g., guns, rifles, knives), all items previously/currently identified as safety concern.   Remove drugs/medications (over-the-counter, prescriptions, illicit drugs), all items previously/currently identified as a safety concern.  The family member/significant other verbalizes understanding of the suicide prevention education information provided.  The family member/significant other agrees to remove the items of safety concern listed above.  Mother states there are no firearms in the home.  She says there is alcohol, but the ptaient won't touch alcohol.  The patient is continuing forward with her plans to move out soon, but mother and family support will continue even after she moves out.  Mother has purchased a lock box for the prescription medications, as well as a daily planner for medicines which breaks apart, so mom can leave out one day's meds at a time.  CSW gave  positive feedback for getting the lockbox and encouraged mother to put over-the-counter medicines in it as well.  She was more than willing to do so, given the patient's impulsivity at this recent overdose.    Carloyn Jaeger Grossman-Orr 09/25/2020, 3:03 PM

## 2020-09-25 NOTE — Progress Notes (Signed)
[facility] MD Progress Note  09/25/2020 8:03 AM Brenda Newman  MRN:  [id number]  Subjective: Brenda Newman is a 20 y.o. female who identifies as asexual and female desiring to transition to female transgender, with self-reported past psychiatric history significant for MDD, GAD, social anxiety d/o, PTSD, and r/o BPD who was admitted under IVC after a suicide attempt via ingestion of 8 tabs of  Prozac. The patient is currently on Hospital Day 1.   Chart Review from last 24 hours:  The patient's chart was reviewed and nursing notes were reviewed. The patient's case was discussed in multidisciplinary team meeting. Per nursing she attended all but 1 group and had no behavior or safety concerns noted. Sleep 6.75 hours. Per MAR she was compliant with scheduled medications except for missing Levsin last night. She required no PRNs.   Information Obtained Today During Patient Interview: The patient was seen and evaluated on the unit. On assessment today the patient reports that she is feeling much improved today and describes feeling that "my head space is quieter." She describes "feeling closer to my normal self" today. She states her AH of whispers or hearing her name called are drastically reduced and her paranoia is improving. She states she feels safe on the unit and was able to open up and share in group today. She talked to her mother and a peer on the phone and states she is not finding herself ruminating as much or worrying that her friends are conspiring against her. She denies ideas of reference or first rank symptoms and denies VH. She feels more hopeful that she can reconcile her relationships with her boyfriend and peers once discharge and is trying to work on identifying and correcting negative cognitive distortions. She had some trouble falling asleep last night but did not want PRN medication for insomnia. She denies side-effects with start of medications. She reports fair appetite and voices no  physical complaints. We discussed her elevated WBC and she was advised that this could represent a stress response but will need to be rechecked after discharge. She denies SI, HI, or urges for SIB.  Principal Problem: MDD (major depressive disorder), recurrent, severe, with psychosis (HCC) Diagnosis: Principal Problem:   MDD (major depressive disorder), recurrent, severe, with psychosis (HCC) Active Problems:   Social anxiety disorder   PTSD (post-traumatic stress disorder)  Total Time Spent in Direct Patient Care:  I personally spent 30 minutes on the unit in direct patient care. The direct patient care time included face-to-face time with the patient, reviewing the patient's chart, communicating with other professionals, and coordinating care. Greater than 50% of this time was spent in counseling or coordinating care with the patient regarding goals of hospitalization, psycho-education, and discharge planning needs.  Past Psychiatric History: see admission H&P  Past Medical History: see admission H&P  Family History:  Family History  Problem Relation Age of Onset   Diabetes Mother    Family Psychiatric  History: see admission H&P  Social History:  Social History   Substance and Sexual Activity  Alcohol Use Never     Social History   Substance and Sexual Activity  Drug Use Never    Social History   Socioeconomic History   Marital status: Single    Spouse name: Not on file   Number of children: Not on file   Years of education: Not on file   Highest education level: Not on file  Occupational History   Not on file  Tobacco Use  Smoking status: Never   Smokeless tobacco: Never  Vaping Use   Vaping Use: Never used  Substance and Sexual Activity   Alcohol use: Never   Drug use: Never   Sexual activity: Not on file  Other Topics Concern   Not on file  Social History Narrative   Not on file   Social Determinants of Health   Financial Resource Strain: Not on  file  Food Insecurity: Not on file  Transportation Needs: Not on file  Physical Activity: Not on file  Stress: Not on file  Social Connections: Not on file   Sleep: Fair  Appetite:  Fair  Current Medications: Current Facility-Administered Medications  Medication Dose Route Frequency Provider Last Rate Last Admin   acetaminophen (TYLENOL) tablet 650 mg  650 mg Oral Q6H PRN Bobbitt, Shalon E, NP       alum & mag hydroxide-simeth (MAALOX/MYLANTA) 200-200-20 MG/5ML suspension 30 mL  30 mL Oral Q4H PRN Bobbitt, Shalon E, NP       escitalopram (LEXAPRO) tablet 10 mg  10 mg Oral Daily Mason Jim, Makynzie Dobesh E, MD   10 mg at 09/25/20 0748   ferrous sulfate tablet 325 mg  325 mg Oral Q breakfast Bartholomew Crews E, MD   325 mg at 09/25/20 0748   hyoscyamine (LEVSIN SL) SL tablet 0.125 mg  0.125 mg Sublingual TID WC & HS Mason Jim, Suzy Kugel E, MD   0.125 mg at 09/25/20 0748   magnesium hydroxide (MILK OF MAGNESIA) suspension 30 mL  30 mL Oral Daily PRN Bobbitt, Shalon E, NP       pantoprazole (PROTONIX) EC tablet 40 mg  40 mg Oral Daily Mason Jim, Kathrine Rieves E, MD   40 mg at 09/25/20 0747   traZODone (DESYREL) tablet 50 mg  50 mg Oral QHS PRN Bobbitt, Shalon E, NP        Lab Results:  Results for orders placed or performed during the hospital encounter of 09/24/20 (from the past 48 hour(s))  TSH     Status: None   Collection Time: 09/24/20  6:44 AM  Result Value Ref Range   TSH 1.307 0.350 - 4.500 uIU/mL    Comment: Performed by a 3rd Generation assay with a functional sensitivity of <=0.01 uIU/mL. Performed at [facility], [address] [place], [place] [address]   Hemoglobin A1c     Status: None   Collection Time: 09/24/20  6:44 AM  Result Value Ref Range   Hgb A1c MFr Bld 5.3 4.8 - 5.6 %    Comment: (NOTE) Pre diabetes:          5.7%-6.4%  Diabetes:              >6.4%  Glycemic control for   <7.0% adults with diabetes    Mean Plasma Glucose 105.41 mg/dL    Comment: Performed at  [facility], [address] [place], [place] [address]  Lipid panel     Status: None   Collection Time: 09/24/20  6:44 AM  Result Value Ref Range   Cholesterol 152 0 - 200 mg/dL   Triglycerides 47 <102 mg/dL   HDL 48 >58 mg/dL   Total CHOL/HDL Ratio 3.2 RATIO   VLDL 9 0 - 40 mg/dL   LDL Cholesterol 95 0 - 99 mg/dL    Comment:        Total Cholesterol/HDL:CHD Risk Coronary Heart Disease Risk Table  Men   Women  1/2 Average Risk   3.4   3.3  Average Risk       5.0   4.4  2 X Average Risk   9.6   7.1  3 X Average Risk  23.4   11.0        Use the calculated Patient Ratio above and the CHD Risk Table to determine the patient's CHD Risk.        ATP III CLASSIFICATION (LDL):  <100     mg/dL   Optimal  [id number]  mg/dL   Near or Above                    Optimal  130-159  mg/dL   Borderline  [id number]  mg/dL   High  >027>190     mg/dL   Very High Performed at [facility], [address] [place], [place] [address]   CBC with Differential/Platelet     Status: Abnormal   Collection Time: 09/25/20  6:40 AM  Result Value Ref Range   WBC 14.2 (H) 4.0 - 10.5 K/uL   RBC 4.37 3.87 - 5.11 MIL/uL   Hemoglobin 12.6 12.0 - 15.0 g/dL   HCT 44.039.6 34.736.0 - 42.546.0 %   MCV 90.6 80.0 - 100.0 fL   MCH 28.8 26.0 - 34.0 pg   MCHC 31.8 30.0 - 36.0 g/dL   RDW 95.614.6 38.711.5 - 56.415.5 %   Platelets 349 150 - 400 K/uL   nRBC 0.0 0.0 - 0.2 %   Neutrophils Relative % 62 %   Neutro Abs 8.7 (H) 1.7 - 7.7 K/uL   Lymphocytes Relative 30 %   Lymphs Abs 4.2 (H) 0.7 - 4.0 K/uL   Monocytes Relative 7 %   Monocytes Absolute 1.0 0.1 - 1.0 K/uL   Eosinophils Relative 1 %   Eosinophils Absolute 0.2 0.0 - 0.5 K/uL   Basophils Relative 0 %   Basophils Absolute 0.1 0.0 - 0.1 K/uL   Immature Granulocytes 0 %   Abs Immature Granulocytes 0.06 0.00 - 0.07 K/uL    Comment: Performed at [facility], [address] [place], [place] [address]    Blood Alcohol level:   Lab Results  Component Value Date   ETH <10 09/23/2020    Metabolic Disorder Labs: Lab Results  Component Value Date   HGBA1C 5.3 09/24/2020   MPG 105.41 09/24/2020   No results found for: PROLACTIN Lab Results  Component Value Date   CHOL 152 09/24/2020   TRIG 47 09/24/2020   HDL 48 09/24/2020   CHOLHDL 3.2 09/24/2020   VLDL 9 09/24/2020   LDLCALC 95 09/24/2020    Physical Findings:  Musculoskeletal: Strength & Muscle Tone: within normal limits Gait & Station: normal, steady Patient leans: N/A Psychiatric Specialty Exam: Physical Exam Vitals reviewed. HENT:    Head: Normocephalic. Pulmonary:    Effort: Pulmonary effort is normal. Musculoskeletal:    Comments: Linear, superficial cuts on left inner forearm  Neurological:    General: No focal deficit present.    Review of Systems - denies CP, SOB, abdominal pain, N/V/D  General Appearance:  casually dressed, adequate hygiene  Eye Contact:  Good  Speech:  Clear and Coherent and Normal Rate  Volume:  Normal  Mood:  described as improving - appears calm   Affect:  brighter affect, moderate, stable  Thought Process:  Goal Directed and Linear  Orientation:  Full (Time, Place, and Person)  Thought  Content:   Intermittent residual AH of whispers and resolving paranoia; denies ideas of reference, first rank symptoms or active delusions  Suicidal Thoughts:  Denied  Homicidal Thoughts: Denied  Memory:  Recent;   Good  Judgement:  Improving   Insight:  Present  Psychomotor Activity:  Normal  Concentration:  Concentration: Good and Attention Span: Good  Recall:  Good  Fund of Knowledge:  Good  Language:  Good  Akathisia:  Negative  Assets:  Communication Skills Desire for Improvement Housing Resilience Social Support  ADL's:  Intact  Cognition:  WNL   Blood pressure 112/76, pulse 99, temperature 98.2 F (36.8 C), temperature source Oral, last menstrual period 09/01/2020, SpO2 99 %. There is no height or  weight on file to calculate BMI.  Treatment Plan Summary: Diagnoses / Active Problems: MDD recurrent severe with psychotic features GAD by hx Social anxiety d/o by hx PTSD by hx R/o Borderline personality d/o   PLAN: Safety and Monitoring:             -- Patient converted to voluntary admission to inpatient psychiatric unit for safety, stabilization and treatment             -- Daily contact with patient to assess and evaluate symptoms and progress in treatment             -- Patient's case to be discussed in multi-disciplinary team meeting             -- Observation Level : q15 minute checks             -- Vital signs:  q12 hours             -- Precautions: suicide   2. Psychiatric Diagnoses and Treatment:              MDD recurrent severe with psychotic features GAD by hx Social anxiety d/o by hx PTSD by hx R/o Borderline personality d/o -- Titrating up to Lexapro 10mg  today  -- Trazodone 50mg  po qhs PRN insomnia and Vistaril 25mg  tid PRN anxiety --Continue Seroquel 50mg  po qhs for help with AH and paranoia as well as anxiety             -- Would benefit from DBT after discharge             -- Metabolic labs while on atypical antipsychotic: cholesterol 152, triglycerides 47, HDL 48, LDL 95; A1c 5.3; QTC             -- Encouraged patient to participate in unit milieu and in scheduled group therapies             -- Short Term Goals: Ability to verbalize feelings will improve, Ability to disclose and discuss suicidal ideas, Ability to demonstrate self-control will improve, Ability to identify and develop effective coping behaviors will improve, and Ability to maintain clinical measurements within normal limits will improve             -- Long Term Goals: Improvement in symptoms so as ready for discharge              3. Medical Issues Being Addressed:              Anemia             -- restarted home iron dose (declines use of concomitant stool softener)                Nonspecific GI issues             --  Restarted home Levsin dose tid with meals and qhs             -- Protonix 40mg  daily               Arm cuts             -- Per patient TD current             -- Topical neosporin PRN   Leucocytosis  WBC down to 14.2 today from 15 - continue to trend               Admission labs reviewed: TSH 1.307, A1c 5.3, lipid panel WNL; quant HCG <5, respiratory panel negative, UDS negative, Mag 2.2, WBC 15, H/H 13.3/41.3 and platelets 383; Tylenol <10,Salicylate <7, ETOH <10, CMP WNL except for glucose 115 and total protein 8.6.EKG NSR 99bpm with QTC [birth year]1974.   4. Discharge Planning:             -- Social work and case management to assist with discharge planning and identification of hospital follow-up needs prior to discharge             -- Estimated LOS: 2-3 days             -- Discharge Concerns: Need to establish a safety plan; Medication compliance and effectiveness             -- Discharge Goals: Return home with outpatient referrals for mental health follow-up including medication management/psychotherapy   I certify that inpatient services furnished can reasonably be expected to improve the patient's condition.    , MD, FAPA 09/25/2020, 8:03 AM

## 2020-09-25 NOTE — Progress Notes (Signed)
Child/Adolescent Psychoeducational Group Note  Date:  09/25/2020 Time:  10:34 AM  Group Topic/Focus:  Goals Group:   The focus of this group is to help patients establish daily goals to achieve during treatment and discuss how the patient can incorporate goal setting into their daily lives to aide in recovery.  Participation Level:  Active  Participation Quality:  Appropriate  Affect:  Appropriate  Cognitive:  Alert  Insight:  Appropriate, Good, and Improving  Engagement in Group:  Engaged  Modes of Intervention:  Discussion  Additional Comments:  Pt attended group and participated in discussion.  Dajana Gehrig R Ayn Domangue 09/25/2020, 10:34 AM

## 2020-09-25 NOTE — [facility] Group Notes (Signed)
[facility] Group Notes:  (Nursing/MHT/Case Management/Adjunct)   Date:  09/25/2020  Time:  4:52 PM   Type of Therapy:   Relaxation therapy   Participation Level:  Active   Participation Quality:  Attentive   Affect:  Blunted   Cognitive:  Appropriate   Insight:  Improving   Engagement in Group:  Improving   Modes of Intervention:  Activity   Summary of Progress/Problems:  Meditation activity.  She participated well with the activity.     Norm Parcel Jencarlos Nicolson 09/25/2020, 4:52 PM

## 2020-09-25 NOTE — [facility] Group Notes (Signed)
[facility] Group Notes:  (Nursing/MHT/Case Management/Adjunct)  Date:  09/25/2020  Time:  2:41 PM  Type of Therapy:  Nurse Education  Participation Level:  Active  Participation Quality:  Appropriate and Attentive  Affect:  Appropriate  Cognitive:  Alert and Appropriate  Insight:  Improving  Engagement in Group:  Engaged  Modes of Intervention:  Discussion and Education  Summary of Progress/Problems:  Discussed sleep hygiene.  Handout provided.  She was active in the group conversation.  Markos Theil V Addam Goeller 09/25/2020, 2:41 PM  

## 2020-09-25 NOTE — [facility] Group Notes (Signed)
[facility] LCSW Group Therapy Note  Date/Time:  09/25/2020 9:00-10:00 or 10:00-11:00AM  Type of Therapy and Topic:  Group Therapy:  Healthy and Unhealthy Supports  Participation Level:  Active   Description of Group:  Patients in this group were introduced to the idea of adding a variety of healthy supports to address the various needs in their lives.Patients discussed what additional healthy supports could be helpful in their recovery and wellness after discharge in order to prevent future hospitalizations.   An emphasis was placed on using counselor, doctor, therapy groups, 12-step groups, and problem-specific support groups to expand supports.  They also worked as a group on developing a specific plan for several patients to deal with unhealthy supports through boundary-setting, psychoeducation with loved ones, and even termination of relationships.   Therapeutic Goals:   1)  discuss importance of adding supports to stay well once out of the hospital  2)  compare healthy versus unhealthy supports and identify some examples of each  3)  generate ideas and descriptions of healthy supports that can be added  4)  offer mutual support about how to address unhealthy supports  5)  encourage active participation in and adherence to discharge plan    Summary of Patient Progress:  The patient stated that current healthy supports in her life are her family and her partner while current unhealthy supports include a previous bad relationship.  The patient expressed a willingness to add therapy as support(s) to help in her recovery journey.   Therapeutic Modalities:   Motivational Interviewing Brief Solution-Focused Therapy  Evorn Gong

## 2020-09-25 NOTE — Progress Notes (Signed)
DAR Note: Patient with euthymic affect, denies SI/HI/AVH, reports a good appetite, reports good sleep quality last night, and is visible on the unit participating in activities and interacting with peers. Pt denies any current concerns, is being maintained on Q15 minute checks for safety.   09/25/20 1021  Psych Admission Type (Psych Patients Only)  Admission Status Involuntary  Psychosocial Assessment  Patient Complaints None  Eye Contact Fair  Facial Expression Flat  Affect Appropriate to circumstance  Speech Logical/coherent  Interaction Assertive  Motor Activity Other (Comment) (patient ambulatory with steady gait)  Appearance/Hygiene Unremarkable  Behavior Characteristics Cooperative  Mood Pleasant;Euthymic  Thought Process  Coherency WDL  Content WDL  Delusions WDL  Perception WDL  Hallucination None reported or observed  Judgment WDL  Confusion WDL  Danger to Self  Current suicidal ideation? Denies  Self-Injurious Behavior No self-injurious ideation or behavior indicators observed or expressed   Agreement Not to Harm Self Yes  Description of Agreement contracts  Danger to Others  Danger to Others None reported or observed

## 2020-09-26 NOTE — [facility] Group Notes (Signed)
LCSW Group Therapy Note  Type of Therapy/Topic: Group Therapy: Six Dimensions of Wellness  Participation Level: Did Not Attend  Description of Group: This group will address the concept of wellness and the six concepts of wellness: occupational, physical, social, intellectual, spiritual, and emotional. Patients will be encouraged to process areas in their lives that are out of balance and identify reasons for remaining unbalanced. Patients will be encouraged to explore ways to practice healthy habits daily to attain better physical and mental health outcomes.  Therapeutic Goals: 1. Identify aspects of wellness that they are doing well. 2. Identify aspects of wellness that they would like to improve upon. 3. Identify one action they can take to improve an aspect of wellness in their lives.   Summary of Patient Progress: Pt did not attend despite personal invitation from staff.     Therapeutic Modalities: Cognitive Behavioral Therapy Solution-Focused Therapy Relapse Prevention   Markiyah Gahm, LCSWA Clinicial Social Worker [facility]  

## 2020-09-26 NOTE — [facility] Group Notes (Signed)
ADULT GRIEF GROUP NOTE:   Spiritual care group on grief and loss facilitated by chaplain Dyanne Carrel, [facility]   Group Goal:   Support / Education around grief and loss   Members engage in facilitated group support and psycho-social education.   Group Description:   Following introductions and group rules, group members engaged in facilitated group dialog and support around topic of loss, with particular support around experiences of loss in their lives. Group Identified types of loss (relationships / self / things) and identified patterns, circumstances, and changes that precipitate losses. Reflected on thoughts / feelings around loss, normalized grief responses, and recognized variety in grief experience. Group noted Worden's four tasks of grief in discussion.   Group drew on Adlerian / Rogerian, narrative, MI,   Patient Progress: Patient attended group and engaged in group conversation.  Chaplain Dyanne Carrel, Bcc PAger, [phone] 1:23 PM

## 2020-09-26 NOTE — Progress Notes (Signed)
Pt did not attend goals group. 

## 2020-09-26 NOTE — Progress Notes (Addendum)
D:  Patient denied SI and HI, contracts for safety.  Denied A/V hallucinations.   A:  Medications administered per MD orders. R:  Safety maintained with 15 minute checks.  

## 2020-09-26 NOTE — [facility] Group Notes (Signed)
Occupational Therapy Group Note Date: 09/26/2020 Group Topic/Focus: Stress Management  Group Description: Group encouraged increased participation and engagement through discussion focused on topic of stress management. Patients engaged interactively to discuss components of stress including physical signs, emotional signs, negative management strategies, and positive management strategies. Each individual identified one new stress management strategy they would like to try moving forward.    Therapeutic Goals: Identify current stressors Identify healthy vs unhealthy stress management strategies/techniques Discuss and identify physical and emotional signs of stress Participation Level: Active   Participation Quality: Independent   Behavior: Calm, Cooperative, and Interactive   Speech/Thought Process: Focused   Affect/Mood: Full range   Insight: Moderate   Judgement: Moderate   Individualization: Brenda Newman was active in their participation of group discussion/activity. Pt identified current stressors "myself" Appeared receptive to support and feedback from peers.  Modes of Intervention: Activity, Discussion, Education, and Support  Patient Response to Interventions:  Attentive, Engaged, and Receptive   Plan: Continue to engage patient in OT groups 2 - 3x/week.  09/26/2020  Donne Hazel, MOT, OTR/L

## 2020-09-26 NOTE — Progress Notes (Signed)
Recreation Therapy Notes  Date:  6.27.22 Time: 0930  Location: [address] Dayroom  Group Topic: Stress Management  Goal Area(s) Addresses:  Patient will identify positive stress management techniques. Patient will identify benefits of using stress management post d/c.  Intervention: Stress Management  Activity : Meditation.  LRT played a meditation that focused on being resilient when faced with adversity.     Education:  Stress Management, Discharge Planning.   Education Outcome: Acknowledges Education  Clinical Observations/Feedback: Pt did not attend group.    Caroll Rancher, LRT/CTRS         Caroll Rancher A 09/26/2020 12:51 PM

## 2020-09-26 NOTE — Tx Team (Signed)
Interdisciplinary Treatment and Diagnostic Plan Update  09/26/2020 Time of Session: 11:00am Brenda Newman MRN: [id number]  Principal Diagnosis: MDD (major depressive disorder), recurrent, severe, with psychosis ([place])  Secondary Diagnoses: Principal Problem:   MDD (major depressive disorder), recurrent, severe, with psychosis ([place]) Active Problems:   Social anxiety disorder   PTSD (post-traumatic stress disorder)   Current Medications:  Current Facility-Administered Medications  Medication Dose Route Frequency Provider Last Rate Last Admin   acetaminophen (TYLENOL) tablet 650 mg  650 mg Oral Q6H PRN Bobbitt, Shalon E, NP       alum & mag hydroxide-simeth (MAALOX/MYLANTA) 200-200-20 MG/5ML suspension 30 mL  30 mL Oral Q4H PRN Bobbitt, Shalon E, NP       escitalopram (LEXAPRO) tablet 10 mg  10 mg Oral Daily Nelda Marseille, Amy E, MD   10 mg at 09/26/20 0957   ferrous sulfate tablet 325 mg  325 mg Oral Q breakfast Harlow Asa, MD   325 mg at 09/26/20 2751   hyoscyamine (LEVSIN SL) SL tablet 0.125 mg  0.125 mg Sublingual TID WC & HS Nelda Marseille, Amy E, MD   0.125 mg at 09/26/20 0957   magnesium hydroxide (MILK OF MAGNESIA) suspension 30 mL  30 mL Oral Daily PRN Bobbitt, Shalon E, NP       neomycin-bacitracin-polymyxin (NEOSPORIN) ointment   Topical BID PRN Nelda Marseille, Amy E, MD       pantoprazole (PROTONIX) EC tablet 40 mg  40 mg Oral Daily Nelda Marseille, Amy E, MD   40 mg at 09/26/20 7001   traZODone (DESYREL) tablet 50 mg  50 mg Oral QHS PRN Bobbitt, Shalon E, NP       PTA Medications: Medications Prior to Admission  Medication Sig Dispense Refill Last Dose   ferrous sulfate 324 MG TBEC Take 324 mg by mouth daily with breakfast.      FLUoxetine (PROZAC) 40 MG capsule Take 40 mg by mouth daily.      hyoscyamine (LEVSIN SL) 0.125 MG SL tablet Place 0.125 mg under the tongue 4 (four) times daily -  before meals and at bedtime.      ibuprofen (ADVIL) 400 MG tablet Take 400 mg by mouth every 6  (six) hours as needed for headache or mild pain.      Multiple Vitamin (MULTIVITAMIN WITH MINERALS) TABS tablet Take 1 tablet by mouth daily.      omeprazole (PRILOSEC) 40 MG capsule Take 40 mg by mouth daily.       Patient Stressors: Loss of boyfriend Marital or family conflict  Patient Strengths: Ability for insight Average or above average intelligence Capable of independent living Communication skills Physical Health Supportive family/friends  Treatment Modalities: Medication Management, Group therapy, Case management,  1 to 1 session with clinician, Psychoeducation, Recreational therapy.   Physician Treatment Plan for Primary Diagnosis: MDD (major depressive disorder), recurrent, severe, with psychosis ([place]) Long Term Goal(s): Improvement in symptoms so as ready for discharge   Short Term Goals: Ability to verbalize feelings will improve Ability to disclose and discuss suicidal ideas Ability to demonstrate self-control will improve Ability to identify and develop effective coping behaviors will improve Ability to maintain clinical measurements within normal limits will improve  Medication Management: Evaluate patient's response, side effects, and tolerance of medication regimen.  Therapeutic Interventions: 1 to 1 sessions, Unit Group sessions and Medication administration.  Evaluation of Outcomes: Not Met  Physician Treatment Plan for Secondary Diagnosis: Principal Problem:   MDD (major depressive disorder), recurrent, severe, with psychosis ([place]) Active  Problems:   Social anxiety disorder   PTSD (post-traumatic stress disorder)  Long Term Goal(s): Improvement in symptoms so as ready for discharge   Short Term Goals: Ability to verbalize feelings will improve Ability to disclose and discuss suicidal ideas Ability to demonstrate self-control will improve Ability to identify and develop effective coping behaviors will improve Ability to maintain clinical measurements  within normal limits will improve     Medication Management: Evaluate patient's response, side effects, and tolerance of medication regimen.  Therapeutic Interventions: 1 to 1 sessions, Unit Group sessions and Medication administration.  Evaluation of Outcomes: Not Met   RN Treatment Plan for Primary Diagnosis: MDD (major depressive disorder), recurrent, severe, with psychosis ([place]) Long Term Goal(s): Knowledge of disease and therapeutic regimen to maintain health will improve  Short Term Goals: Ability to remain free from injury will improve, Ability to verbalize frustration and anger appropriately will improve, Ability to demonstrate self-control, Ability to participate in decision making will improve, Ability to verbalize feelings will improve, and Ability to disclose and discuss suicidal ideas  Medication Management: RN will administer medications as ordered by provider, will assess and evaluate patient's response and provide education to patient for prescribed medication. RN will report any adverse and/or side effects to prescribing provider.  Therapeutic Interventions: 1 on 1 counseling sessions, Psychoeducation, Medication administration, Evaluate responses to treatment, Monitor vital signs and CBGs as ordered, Perform/monitor CIWA, COWS, AIMS and Fall Risk screenings as ordered, Perform wound care treatments as ordered.  Evaluation of Outcomes: Not Met   LCSW Treatment Plan for Primary Diagnosis: MDD (major depressive disorder), recurrent, severe, with psychosis ([place]) Long Term Goal(s): Safe transition to appropriate next level of care at discharge, Engage patient in therapeutic group addressing interpersonal concerns.  Short Term Goals: Engage patient in aftercare planning with referrals and resources, Increase social support, Increase ability to appropriately verbalize feelings, Increase emotional regulation, and Facilitate acceptance of mental health diagnosis and  concerns  Therapeutic Interventions: Assess for all discharge needs, 1 to 1 time with Social worker, Explore available resources and support systems, Assess for adequacy in community support network, Educate family and significant other(s) on suicide prevention, Complete Psychosocial Assessment, Interpersonal group therapy.  Evaluation of Outcomes: Not Met   Progress in Treatment: Attending groups: Yes. and No. Participating in groups: Yes. and No. Taking medication as prescribed: Yes. Toleration medication: Yes. Family/Significant other contact made: Yes, individual(s) contacted:  mother Patient understands diagnosis: Yes. Discussing patient identified problems/goals with staff: Yes. Medical problems stabilized or resolved: Yes. Denies suicidal/homicidal ideation: Yes. Issues/concerns per patient self-inventory: No.   New problem(s) identified: No, Describe:  none  New Short Term/Long Term Goal(s): medication stabilization, elimination of SI thoughts, development of comprehensive mental wellness plan.    Patient Goals:  Did not attend  Discharge Plan or Barriers: Established with [facility] for therapy and medication management   Reason for Continuation of Hospitalization: Anxiety Depression Medication stabilization Suicidal ideation  Estimated Length of Stay: 3-5 days  Attendees: Patient: Did not attend 09/26/2020   Physician:  09/26/2020   Nursing:  09/26/2020   RN Care Manager: 09/26/2020   Social Worker: Brenda Moll, LCSW 09/26/2020   Recreational Therapist:  09/26/2020   Other:  09/26/2020  Other:  09/26/2020   Other: 09/26/2020     Scribe for Treatment Team: Brenda Moselle, LCSW 09/26/2020 11:14 AM

## 2020-09-26 NOTE — [facility] Group Notes (Signed)
Patient did not attend evening wrap up group. 

## 2020-09-26 NOTE — Progress Notes (Signed)
[facility] MD Progress Note  09/26/2020 8:14 AM Brenda Newman  MRN:  [id number]  Subjective: Brenda Newman is a 20 y.o. female who identifies as asexual and female desiring to transition to female transgender, with self-reported past psychiatric history significant for MDD, GAD, social anxiety d/o, PTSD, and r/o BPD who was admitted under IVC after a suicide attempt via ingestion of 8 tabs of 40mg  Prozac. The patient is currently on Hospital Day 2.   Chart Review from last 24 hours:  The patient's chart was reviewed and nursing notes were reviewed. The patient's case was discussed in multidisciplinary team meeting. Per nursing she was visible on the unit and attended and participated in groups. No acute safety concerns or behavioral concerns noted. Per [facility] she has been compliant with scheduled medications and required no PRNs. Slept 5.75 hours.  Information Obtained Today During Patient Interview: The patient was seen and evaluated on the unit. She states her mood is fair today. She attended a group on grief this morning and admits this triggered her to ruminate some on her relationship stressors with her boyfriend. She states she is "processing" these emotions this morning but overall feels "more focused" and "steadfast" in her desire to improve. She describes feeling that her "mind is more clear" and her thoughts "are no longer swarming." She reports socializing with peers yesterday on the unit and reports that she is attending groups. She denies SI or urges for self harm and denies HI. She states her AH have resolved and she denies feelings of paranoia. She denies ideas of reference or first rank symptoms. She reports fair sleep and improving appetite. She had some mild nausea after supper last night which she attributes to food choices. She otherwise denies physical complaints. She denies noted medication side-effects. We discussed plans to repeat her WBC tomorrow for trending.   Principal Problem: MDD  (major depressive disorder), recurrent, severe, with psychosis ([facility]) Diagnosis: Principal Problem:   MDD (major depressive disorder), recurrent, severe, with psychosis ([facility]) Active Problems:   Social anxiety disorder   PTSD (post-traumatic stress disorder)  Total Time Spent in Direct Patient Care:  I personally spent 25 minutes on the unit in direct patient care. The direct patient care time included face-to-face time with the patient, reviewing the patient's chart, communicating with other professionals, and coordinating care. Greater than 50% of this time was spent in counseling or coordinating care with the patient regarding goals of hospitalization, psycho-education, and discharge planning needs.  Past Psychiatric History: see admission H&P  Past Medical History: see admission H&P  Family History:  Family History  Problem Relation Age of Onset   Diabetes Mother    Family Psychiatric  History: see admission H&P  Social History:  Social History   Substance and Sexual Activity  Alcohol Use Never     Social History   Substance and Sexual Activity  Drug Use Never    Social History   Socioeconomic History   Marital status: Single    Spouse name: Not on file   Number of children: Not on file   Years of education: Not on file   Highest education level: Not on file  Occupational History   Not on file  Tobacco Use   Smoking status: Never   Smokeless tobacco: Never  Vaping Use   Vaping Use: Never used  Substance and Sexual Activity   Alcohol use: Never   Drug use: Never   Sexual activity: Not on file  Other Topics Concern  Not on file  Social History Narrative   Not on file   Social Determinants of Health   Financial Resource Strain: Not on file  Food Insecurity: Not on file  Transportation Needs: Not on file  Physical Activity: Not on file  Stress: Not on file  Social Connections: Not on file   Sleep: Fair  Appetite:  Fair  Current Medications: Current  Facility-Administered Medications  Medication Dose Route Frequency Provider Last Rate Last Admin   acetaminophen (TYLENOL) tablet 650 mg  650 mg Oral Q6H PRN Bobbitt, Shalon E, NP       alum & mag hydroxide-simeth (MAALOX/MYLANTA) 200-200-20 MG/5ML suspension 30 mL  30 mL Oral Q4H PRN Bobbitt, Shalon E, NP       escitalopram (LEXAPRO) tablet 10 mg  10 mg Oral Daily Mason Jim, Marji Kuehnel E, MD   10 mg at 09/25/20 0748   ferrous sulfate tablet 325 mg  325 mg Oral Q breakfast Comer Locket, MD   325 mg at 09/25/20 0748   hyoscyamine (LEVSIN SL) SL tablet 0.125 mg  0.125 mg Sublingual TID WC & HS Mason Jim, Didier Brandenburg E, MD   0.125 mg at 09/25/20 2129   magnesium hydroxide (MILK OF MAGNESIA) suspension 30 mL  30 mL Oral Daily PRN Bobbitt, Shalon E, NP       neomycin-bacitracin-polymyxin (NEOSPORIN) ointment   Topical BID PRN Comer Locket, MD       pantoprazole (PROTONIX) EC tablet 40 mg  40 mg Oral Daily Mason Jim, Rhys Lichty E, MD   40 mg at 09/25/20 0747   traZODone (DESYREL) tablet 50 mg  50 mg Oral QHS PRN Bobbitt, Shalon E, NP        Lab Results:  Results for orders placed or performed during the hospital encounter of 09/24/20 (from the past 48 hour(s))  CBC with Differential/Platelet     Status: Abnormal   Collection Time: 09/25/20  6:40 AM  Result Value Ref Range   WBC 14.2 (H) 4.0 - 10.5 K/uL   RBC 4.37 3.87 - 5.11 MIL/uL   Hemoglobin 12.6 12.0 - 15.0 g/dL   HCT 09.4 70.9 - 62.8 %   MCV 90.6 80.0 - 100.0 fL   MCH 28.8 26.0 - 34.0 pg   MCHC 31.8 30.0 - 36.0 g/dL   RDW 36.6 29.4 - 76.5 %   Platelets 349 150 - 400 K/uL   nRBC 0.0 0.0 - 0.2 %   Neutrophils Relative % 62 %   Neutro Abs 8.7 (H) 1.7 - 7.7 K/uL   Lymphocytes Relative 30 %   Lymphs Abs 4.2 (H) 0.7 - 4.0 K/uL   Monocytes Relative 7 %   Monocytes Absolute 1.0 0.1 - 1.0 K/uL   Eosinophils Relative 1 %   Eosinophils Absolute 0.2 0.0 - 0.5 K/uL   Basophils Relative 0 %   Basophils Absolute 0.1 0.0 - 0.1 K/uL   Immature Granulocytes 0 %    Abs Immature Granulocytes 0.06 0.00 - 0.07 K/uL    Comment: Performed at [facility], [address], [place], [place] [address]    Blood Alcohol level:  Lab Results  Component Value Date   ETH <10 09/23/2020    Metabolic Disorder Labs: Lab Results  Component Value Date   HGBA1C 5.3 09/24/2020   MPG 105.41 09/24/2020   No results found for: PROLACTIN Lab Results  Component Value Date   CHOL 152 09/24/2020   TRIG 47 09/24/2020   HDL 48 09/24/2020   CHOLHDL 3.2  09/24/2020   VLDL 9 09/24/2020   LDLCALC 95 09/24/2020    Physical Findings:  Musculoskeletal: Strength & Muscle Tone: within normal limits Gait & Station: normal, steady Patient leans: N/A Psychiatric Specialty Exam: Physical Exam Vitals reviewed. HENT:    Head: Normocephalic. Pulmonary:    Effort: Pulmonary effort is normal. Musculoskeletal:    Comments: Linear, superficial cuts on left inner forearm  Neurological:    General: No focal deficit present.    Review of Systems - denies CP, SOB, abdominal pain, vomiting or diarrhea - had some nausea now resolved  General Appearance:  casually dressed, adequate hygiene  Eye Contact:  Good  Speech:  Clear and Coherent and Normal Rate  Volume:  Normal  Mood:  mildly anxious   Affect:  brighter affect, mildly anxious  Thought Process:  Goal Directed and Linear  Orientation:  Full (Time, Place, and Person)  Thought Content:   Denies AVH, paranoia, delusions, ideas of reference of first rank symptoms - no acute psychosis on exam  Suicidal Thoughts:  Denied  Homicidal Thoughts: Denied  Memory:  Recent;   Good  Judgement:  Improving   Insight:  Present  Psychomotor Activity:  Normal  Concentration:  Concentration: Good and Attention Span: Good  Recall:  Good  Fund of Knowledge:  Good  Language:  Good  Akathisia:  Negative  Assets:  Communication Skills Desire for Improvement Housing Resilience Social Support  ADL's:  Intact   Cognition:  WNL   Blood pressure 112/76, pulse 99, temperature 98.2 F (36.8 C), temperature source Oral, last menstrual period 09/01/2020, SpO2 99 %. There is no height or weight on file to calculate BMI.  Treatment Plan Summary: Diagnoses / Active Problems: MDD recurrent severe with psychotic features GAD by hx Social anxiety d/o by hx PTSD by hx R/o Borderline personality d/o   PLAN: Safety and Monitoring:             -- Patient converted to voluntary admission to inpatient psychiatric unit for safety, stabilization and treatment             -- Daily contact with patient to assess and evaluate symptoms and progress in treatment             -- Patient's case to be discussed in multi-disciplinary team meeting             -- Observation Level : q15 minute checks             -- Vital signs:  q12 hours             -- Precautions: suicide   2. Psychiatric Diagnoses and Treatment:              MDD recurrent severe with psychotic features GAD by hx Social anxiety d/o by hx PTSD by hx R/o Borderline personality d/o -- Continue Lexapro 10mg  daily -- Trazodone 50mg  po qhs PRN insomnia and Vistaril 25mg  tid PRN anxiety --Continue Seroquel 50mg  po qhs for help with AH and paranoia as well as anxiety             -- Would benefit from DBT after discharge             -- Metabolic labs while on atypical antipsychotic: cholesterol 152, triglycerides 47, HDL 48, LDL 95; A1c 5.3; QTC 441ms             -- Encouraged patient to participate in unit milieu and in scheduled group therapies             --  Short Term Goals: Ability to verbalize feelings will improve, Ability to disclose and discuss suicidal ideas, Ability to demonstrate self-control will improve, Ability to identify and develop effective coping behaviors will improve, and Ability to maintain clinical measurements within normal limits will improve             -- Long Term Goals: Improvement in symptoms so as ready for discharge               3. Medical Issues Being Addressed:              Anemia             -- restarted home iron dose (declines use of concomitant stool softener)               Nonspecific GI issues             -- Restarted home Levsin dose tid with meals and qhs             -- Protonix 40mg  daily               Arm cuts             -- Per patient TD booster current             -- Topical neosporin PRN   Leucocytosis  WBC down to 14.2 today from 15 - continue to trend with repeat tomorrow and encouraged outpatient f/u once home               Admission labs reviewed: TSH 1.307, A1c 5.3, lipid panel WNL; quant HCG <5, respiratory panel negative, UDS negative, Mag 2.2, WBC 15, H/H 13.3/41.3 and platelets 383; Tylenol <10,Salicylate <7, ETOH <10, CMP WNL except for glucose 115 and total protein 8.6.EKG NSR 99bpm with QTC [birth year]1974.   4. Discharge Planning:             -- Social work and case management to assist with discharge planning and identification of hospital follow-up needs prior to discharge             -- Estimated LOS: 1-2 days             -- Discharge Concerns: Need to establish a safety plan; Medication compliance and effectiveness             -- Discharge Goals: Return home with outpatient referrals for mental health follow-up including medication management/psychotherapy   I certify that inpatient services furnished can reasonably be expected to improve the patient's condition.    , MD, FAPA 09/26/2020, 8:14 AM

## 2020-09-27 LAB — CBC WITH DIFFERENTIAL/PLATELET
Abs Immature Granulocytes: 0.06 10*3/uL (ref 0.00–0.07)
Basophils Absolute: 0.1 10*3/uL (ref 0.0–0.1)
Basophils Relative: 1 %
Eosinophils Absolute: 0.2 10*3/uL (ref 0.0–0.5)
Eosinophils Relative: 1 %
HCT: 39.4 % (ref 36.0–46.0)
Hemoglobin: 12.5 g/dL (ref 12.0–15.0)
Immature Granulocytes: 1 %
Lymphocytes Relative: 36 %
Lymphs Abs: 4.7 10*3/uL — ABNORMAL HIGH (ref 0.7–4.0)
MCH: 29.1 pg (ref 26.0–34.0)
MCHC: 31.7 g/dL (ref 30.0–36.0)
MCV: 91.8 fL (ref 80.0–100.0)
Monocytes Absolute: 0.8 10*3/uL (ref 0.1–1.0)
Monocytes Relative: 6 %
Neutro Abs: 7.2 10*3/uL (ref 1.7–7.7)
Neutrophils Relative %: 55 %
Platelets: 369 10*3/uL (ref 150–400)
RBC: 4.29 MIL/uL (ref 3.87–5.11)
RDW: 15 % (ref 11.5–15.5)
WBC: 13 10*3/uL — ABNORMAL HIGH (ref 4.0–10.5)
nRBC: 0 % (ref 0.0–0.2)

## 2020-09-27 MED ORDER — ESCITALOPRAM OXALATE 10 MG PO TABS: 10.0000 mg | ORAL_TABLET | Freq: Every day | ORAL | 0 refills | Status: AC

## 2020-09-27 MED ORDER — TRAZODONE HCL 50 MG PO TABS: 50.0000 mg | ORAL_TABLET | Freq: Every evening | ORAL | 0 refills | Status: AC | PRN

## 2020-09-27 MED ORDER — HYOSCYAMINE SULFATE 0.125 MG SL SUBL: 0.1250 mg | SUBLINGUAL_TABLET | Freq: Three times a day (TID) | SUBLINGUAL | 0 refills | Status: AC

## 2020-09-27 NOTE — Progress Notes (Signed)
  [facility] Adult Case Management Discharge Plan :  Will you be returning to the same living situation after discharge:  Yes,  with mother At discharge, do you have transportation home?: Yes,  mother Do you have the ability to pay for your medications: Yes,  private insurance  Release of information consent forms completed and in the chart;  Patient's signature needed at discharge.  Patient to Follow up at:  Follow-up Information     [facility] Go on 09/28/2020.   Why: You have an appointment for therapy with Luan Pulling on 09/28/20 at 1:00 pm and 10/05/20 at 12:00 pm. (these appointments are in person). You also have an appointment for medication management with Lorin Mercy 10/19/20 at 10:30 am (Virtual telehealth). Contact information: [address] [place] [address] [phone]         [facility] Follow up.   Why: Please contact this facility for DBT services. Contact information: [address], [address], [place] [address]                             [phone]                Next level of care provider has access to [facility] Link:no  Safety Planning and Suicide Prevention discussed: Yes,  mother     Has patient been referred to the Quitline?: N/A patient is not a smoker  Patient has been referred for addiction treatment: N/A  Felizardo Hoffmann, Theresia Majors 09/27/2020, 1:38 PM

## 2020-09-27 NOTE — Plan of Care (Signed)
Nurse discussed anxiety with patient and coping skills.

## 2020-09-27 NOTE — Progress Notes (Signed)
D:  Patient's self inventory sheet, patient has fair sleep, sleep medication not helpful.  Fair appetite, normal energy level, good concentration.  Rated depression 2, hopeless 1, anxiety 6.  Denied withdrawals.  Denied SI.  Denied physical problems.  Denied physical pain.  Goal is discharge with confidence and be close to new friends.  Does have discharge plans. A:  Medications administered per MD orders.  Emotional support and encouragement given patient. R:  Denied SI and HI, contracts for safety.  Denied A/V hallucinations.  Safety maintained with 15 minute checks.

## 2020-09-27 NOTE — Progress Notes (Signed)
   09/26/20 1800  Psych Admission Type (Psych Patients Only)  Admission Status Involuntary  Psychosocial Assessment  Patient Complaints Anxiety  Eye Contact Brief  Facial Expression Flat  Affect Appropriate to circumstance  Speech Logical/coherent  Interaction Assertive  Motor Activity Other (Comment) (WDL)  Appearance/Hygiene Unremarkable  Behavior Characteristics Anxious  Mood Anxious  Thought Process  Coherency WDL  Content WDL  Delusions None reported or observed  Perception WDL  Hallucination None reported or observed  Judgment WDL  Confusion None  Danger to Self  Current suicidal ideation? Denies  Self-Injurious Behavior No self-injurious ideation or behavior indicators observed or expressed   Agreement Not to Harm Self Yes  Description of Agreement contracts  Danger to Others  Danger to Others None reported or observed

## 2020-09-27 NOTE — [facility] Group Notes (Signed)
[facility] Group Notes:  (Nursing/MHT/Case Management/Adjunct)  Date:  09/27/2020  Time:  10:22 AM  Type of Therapy:   Orientation group  Participation Level:  Active  Participation Quality:  Appropriate and Attentive  Affect:  Depressed  Cognitive:  Alert and Appropriate  Insight:  Improving  Engagement in Group:  Engaged  Modes of Intervention:  Discussion and Education  Summary of Progress/Problems:  She reported that she didn't sleep well and felt like the trazodone made her feel lightheaded." Her goal for today is "get out of here with confidence."    Norm Parcel Miriam Kestler 09/27/2020, 10:22 AM

## 2020-09-27 NOTE — Progress Notes (Signed)
Discharge Note:  Patient discharged home with mother.  Suicide prevention information given and discussed with patient who stated she understood and had no questions.  Patient denied SI and HI.  Denied A/V hallucinations.  Patient stated she received all her belongings, clothing, toiletries, etc.  Patient stated she appreciated all assistance received from [facility] staff.  All required discharge information given.  

## 2020-09-27 NOTE — Discharge Summary (Signed)
Physician Discharge Summary Note  Patient:  Brenda Newman is an 20 y.o., female MRN:  [id number] DOB:  2000/08/15 Patient phone:  [phone] (home)  Patient address:   [address] [place] [address],  Total Time spent with patient: 30 minutes  Date of Admission:  09/24/2020 Date of Discharge: 09/27/2020  Reason for Admission:  The patient is a 20y/o who identifies as asexual and female desiring to transition to female transgender, with self-reported past psychiatric history significant for MDD, GAD, social anxiety d/o, PTSD, and r/o BPD who was admitted under IVC after a suicide attempt via ingestion of 8 tabs of 40mg  Prozac.    Principal Problem: MDD (major depressive disorder), recurrent, severe, with psychosis (HCC) Discharge Diagnoses: Principal Problem:   MDD (major depressive disorder), recurrent, severe, with psychosis (HCC) Active Problems:   Social anxiety disorder   PTSD (post-traumatic stress disorder)   Past Psychiatric History: See admission H & P  Past Medical History:  Past Medical History:  Diagnosis Date   Depression    No past surgical history on file. Family History:  Family History  Problem Relation Age of Onset   Diabetes Mother    Family Psychiatric  History: See H & P Social History:  Social History   Substance and Sexual Activity  Alcohol Use Never     Social History   Substance and Sexual Activity  Drug Use Never    Social History   Socioeconomic History   Marital status: Single    Spouse name: Not on file   Number of children: Not on file   Years of education: Not on file   Highest education level: Not on file  Occupational History   Not on file  Tobacco Use   Smoking status: Never   Smokeless tobacco: Never  Vaping Use   Vaping Use: Never used  Substance and Sexual Activity   Alcohol use: Never   Drug use: Never   Sexual activity: Not on file  Other Topics Concern   Not on file  Social History Narrative   Not  on file   Social Determinants of Health   Financial Resource Strain: Not on file  Food Insecurity: Not on file  Transportation Needs: Not on file  Physical Activity: Not on file  Stress: Not on file  Social Connections: Not on file    Hospital Course:  After the above admission evaluation, patient was admitted to the [facility] for medication management and treatment for her symptoms of anxiety, depression and suicidal ideation. Per patient, this is her first psychiatric admission. There were no instances of behavior that required restraints or immediate intervention. Patient remained safe on the unit. There were no instances of self-harming behavior. Patient remained cooperative, participated in group sessions and interacted with staff and patients appropriately.  Home medication of Levsin SL was started. Protonix and fe were initiated. Psychotropic medications initiated: Lexapro 10 mg, Trazodone 50 mg. See medication list below for discharge medications electronically sent to pharmacy. Over the course of her hospitalization, Patient states that her symptoms of anxiety and depression have improved. She denies thoughts of self-harm and suicidal ideation. She expresses that she has learned better coping strategies to alleviate her symptoms. Patient expresses readiness for discharge and future-oriented thinking. Patient encouraged to keep all psychiatric appointments and continue with follow-up.      Physical Findings: AIMS:  , ,  ,  ,    CIWA:    COWS:     Musculoskeletal: Strength &  Muscle Tone: within normal limits Gait & Station: normal Patient leans: N/A   Psychiatric Specialty Exam: SEE PHYSICIAN'S DISCHARGE SRA  Presentation  General Appearance: Casual  Eye Contact:Good  Speech:Normal Rate  Speech Volume:Normal  Handedness:Right   Mood and Affect  Mood:Euthymic  Affect:Congruent   Thought Process  Thought Processes:Coherent  Descriptions of  Associations:Intact  Orientation:Full (Time, Place and Person)  Thought Content:Logical  History of Schizophrenia/Schizoaffective disorder:No data recorded Duration of Psychotic Symptoms:No data recorded Hallucinations:Hallucinations: None  Ideas of Reference:None  Suicidal Thoughts:Suicidal Thoughts: No  Homicidal Thoughts:Homicidal Thoughts: No   Sensorium  Memory:Immediate Good; Recent Good; Remote Good  Judgment:Good  Insight:Good   Executive Functions  Concentration:Good  Attention Span:Good  Recall:Good  Fund of Knowledge:Good  Language:Good   Psychomotor Activity  Psychomotor Activity:Psychomotor Activity: Normal   Assets  Assets:Desire for Improvement; Resilience; Social Support; Housing   Sleep  Sleep:Sleep: Good Number of Hours of Sleep: 6.75    Physical Exam: Physical Exam Vitals and nursing note reviewed.  HENT:     Head: Normocephalic.     Nose: No congestion or rhinorrhea.  Eyes:     General:        Right eye: No discharge.        Left eye: No discharge.  Pulmonary:     Effort: Pulmonary effort is normal.  Musculoskeletal:        General: Normal range of motion.     Cervical back: Normal range of motion.  Neurological:     Mental Status: She is alert and oriented to person, place, and time.   Review of Systems  Psychiatric/Behavioral:  Positive for depression (hx of. Stable for discharge).   Blood pressure 101/76, pulse 95, temperature 98.5 F (36.9 C), temperature source Oral, last menstrual period 09/01/2020, SpO2 99 %. There is no height or weight on file to calculate BMI.   Social History   Tobacco Use  Smoking Status Never  Smokeless Tobacco Never   Tobacco Cessation:  Prescription not provided because: Available over the counter   Blood Alcohol level:  Lab Results  Component Value Date   ETH <10 09/23/2020    Metabolic Disorder Labs:  Lab Results  Component Value Date   HGBA1C 5.3 09/24/2020   MPG 105.41  09/24/2020   No results found for: PROLACTIN Lab Results  Component Value Date   CHOL 152 09/24/2020   TRIG 47 09/24/2020   HDL 48 09/24/2020   CHOLHDL 3.2 09/24/2020   VLDL 9 09/24/2020   LDLCALC 95 09/24/2020    See Psychiatric Specialty Exam and Suicide Risk Assessment completed by Attending Physician prior to discharge.  Discharge destination:  Home  Is patient on multiple antipsychotic therapies at discharge:  No   Has Patient had three or more failed trials of antipsychotic monotherapy by history:  No  Recommended Plan for Multiple Antipsychotic Therapies: NA   Allergies as of 09/27/2020   No Known Allergies      Medication List     STOP taking these medications    FLUoxetine 40 MG capsule Commonly known as: PROZAC   ibuprofen 400 MG tablet Commonly known as: ADVIL       TAKE these medications      Indication  escitalopram 10 MG tablet Commonly known as: LEXAPRO Take 1 tablet (10 mg total) by mouth daily. Start taking on: September 28, 2020  Indication: Major Depressive Disorder   ferrous sulfate 324 MG Tbec Take 324 mg by mouth daily with breakfast.  Indication:  Anemia From Inadequate Iron in the Body   hyoscyamine 0.125 MG SL tablet Commonly known as: LEVSIN SL Place 0.125 mg under the tongue 4 (four) times daily -  before meals and at bedtime. What changed: Another medication with the same name was added. Make sure you understand how and when to take each.  Indication: Irritable Bowel Syndrome   hyoscyamine 0.125 MG SL tablet Commonly known as: LEVSIN SL Place 1 tablet (0.125 mg total) under the tongue 4 (four) times daily -  with meals and at bedtime. What changed: You were already taking a medication with the same name, and this prescription was added. Make sure you understand how and when to take each.  Indication: Irritable Bowel Syndrome   multivitamin with minerals Tabs tablet Take 1 tablet by mouth daily.  Indication: supplement    omeprazole 40 MG capsule Commonly known as: PRILOSEC Take 40 mg by mouth daily.  Indication: Gastroesophageal Reflux Disease   traZODone 50 MG tablet Commonly known as: DESYREL Take 1 tablet (50 mg total) by mouth at bedtime as needed for sleep.  Indication: Trouble Sleeping        Follow-up Information     [facility] Go on 09/28/2020.   Why: You have an appointment for therapy with Luan Pulling on 09/28/20 at 1:00 pm and 10/05/20 at 12:00 pm. (these appointments are in person). You also have an appointment for medication management with Lorin Mercy 10/19/20 at 10:30 am (Virtual telehealth). Contact information: [address] [place] [address] [phone]         [facility] Follow up.   Why: Please contact this facility for DBT services. Contact information: [address],  [place] [address]                             [phone]                Follow-up recommendations:  Follow up with outpatient provider for all your medical care needs. Activity as tolerated. Diet as recommended by your outpatient provider.   Comments:  Take all of your medications as prescribed   Report any side effects to your outpatient provider promptly.  Refrain from alcohol and illegal drug use while taking medications.  In the case of emergency call 911 or go to the nearest emergency department for evaluation/treatment   Signed: Vanetta Mulders, NP, PMHNP--BC 09/27/2020, 6:24 PM
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
## 2020-10-27 ENCOUNTER — Other Ambulatory Visit (HOSPITAL_COMMUNITY): Payer: Self-pay | Admitting: Physician Assistant

## 2020-10-27 ENCOUNTER — Other Ambulatory Visit: Payer: Self-pay | Admitting: Physician Assistant

## 2020-10-27 DIAGNOSIS — R112 Nausea with vomiting, unspecified: Secondary | ICD-10-CM

## 2020-11-18 ENCOUNTER — Ambulatory Visit (HOSPITAL_COMMUNITY)
Admission: RE | Admit: 2020-11-18 | Discharge: 2020-11-18 | Disposition: A | Discharge status: Home / Self Care | Payer: 59 | Source: Ambulatory Visit | Attending: Physician Assistant | Admitting: Physician Assistant

## 2020-11-18 ENCOUNTER — Other Ambulatory Visit: Payer: Self-pay

## 2020-11-18 DIAGNOSIS — R112 Nausea with vomiting, unspecified: Secondary | ICD-10-CM | POA: Diagnosis present

## 2020-11-18 MED ORDER — TECHNETIUM TC 99M MEBROFENIN IV KIT
4.9500 | PACK | Freq: Once | INTRAVENOUS | Status: AC
  Administered 2020-11-18: 4.95 via INTRAVENOUS
